# Patient Record
Sex: Female | Born: 1970 | Race: White | Hispanic: No | Smoking: Current every day smoker
Health system: Southern US, Community
[De-identification: ages and names within clinical notes are randomized; demographics above are authoritative.]

## PROBLEM LIST (undated history)

## (undated) ENCOUNTER — Emergency Department (HOSPITAL_COMMUNITY): Admission: EM | Discharge status: Absent without leave | Payer: BC Managed Care – PPO | Source: Home / Self Care

## (undated) DIAGNOSIS — I10 Essential (primary) hypertension: Secondary | ICD-10-CM

## (undated) DIAGNOSIS — F419 Anxiety disorder, unspecified: Secondary | ICD-10-CM

## (undated) DIAGNOSIS — E669 Obesity, unspecified: Secondary | ICD-10-CM

## (undated) HISTORY — DX: Essential (primary) hypertension: I10

## (undated) HISTORY — DX: Obesity, unspecified: E66.9

---

## 1998-11-29 ENCOUNTER — Other Ambulatory Visit: Admission: RE | Admit: 1998-11-29 | Discharge: 1998-11-29 | Payer: Self-pay | Admitting: Gynecology

## 2000-01-31 ENCOUNTER — Other Ambulatory Visit: Admission: RE | Admit: 2000-01-31 | Discharge: 2000-01-31 | Payer: Self-pay | Admitting: Gynecology

## 2001-04-15 ENCOUNTER — Other Ambulatory Visit: Admission: RE | Admit: 2001-04-15 | Discharge: 2001-04-15 | Payer: Self-pay | Admitting: Gynecology

## 2002-04-22 ENCOUNTER — Other Ambulatory Visit: Admission: RE | Admit: 2002-04-22 | Discharge: 2002-04-22 | Payer: Self-pay | Admitting: Gynecology

## 2003-06-30 ENCOUNTER — Other Ambulatory Visit: Admission: RE | Admit: 2003-06-30 | Discharge: 2003-06-30 | Payer: Self-pay | Admitting: Gynecology

## 2004-07-01 ENCOUNTER — Other Ambulatory Visit: Admission: RE | Admit: 2004-07-01 | Discharge: 2004-07-01 | Payer: Self-pay | Admitting: Gynecology

## 2005-03-20 ENCOUNTER — Encounter: Admission: RE | Admit: 2005-03-20 | Discharge: 2005-03-20 | Payer: Self-pay | Admitting: Gynecology

## 2005-12-19 ENCOUNTER — Inpatient Hospital Stay (HOSPITAL_COMMUNITY): Admission: AD | Admit: 2005-12-19 | Discharge: 2005-12-22 | Payer: Self-pay | Admitting: Obstetrics & Gynecology

## 2007-07-18 ENCOUNTER — Ambulatory Visit (HOSPITAL_COMMUNITY): Admission: RE | Admit: 2007-07-18 | Discharge: 2007-07-18 | Payer: Self-pay | Admitting: Internal Medicine

## 2009-07-16 ENCOUNTER — Ambulatory Visit (HOSPITAL_COMMUNITY): Admission: RE | Admit: 2009-07-16 | Discharge: 2009-07-16 | Payer: Self-pay | Admitting: Family Medicine

## 2010-11-25 NOTE — Op Note (Signed)
Carla Cohen, PANT                 ACCOUNT NO.:  [id number]   MEDICAL RECORD NO.:  [id number]          PATIENT TYPE:  INP   LOCATION:  9131                          FACILITY:  [facility]   PHYSICIAN:  Gerrit Friends. Aldona Bar, M.D.   DATE OF BIRTH:  [birth year]   DATE OF PROCEDURE:  12/19/2005  DATE OF DISCHARGE:                                 OPERATIVE REPORT   PREOPERATIVE DIAGNOSIS:  Arrest of second stage of labor.   POSTOPERATIVE DIAGNOSES:  Arrest of second stage of labor plus delivery of 8  pound 9 ounce female infant, Apgars 08/09.   PROCEDURE:  Primary low transverse cesarean section.   ANESTHESIA:  Epidural.   SURGEON:  Gerrit Friends. Aldona Bar, M.D.   HISTORY:  This 40 year old gravida 1, para 0 presented at term with rupture  of membranes early morning on 12/19/2005.  She was treated with intrapartum  clindamycin secondary to a positive group B strep culture (allergic to  penicillin).  She progressed well during her labor becoming fully dilated at  1700 hours.  At that time the vertex was at +1 to +2.  The patient pushed  for over 2-1/2 hours with minimal descent of the vertex to at best +1 to +2  with a contraction and at best +1 in between contractions.  Fetal heart  remained reassuring because of an arrest disorder second stage of labor  after at least 2-1/2 hours of pushing. Decision was made after discussion  with the patient and husband to proceed to cesarean section for delivery.   The patient was taken to the operating room with a Foley catheter in place  where her epidural was augmented without difficulty.  The patient was  prepped and draped in usual fashion and after good epidural analgesia was  documented. The procedure was begun.  Pfannenstiel incision was made with  minimal difficulty dissected down sharply to and through the fascia in a low  transverse fashion with hemostasis created at each layer.  Subfascial space  was created inferiorly and superiorly, muscles separated  midline.  Peritoneum identified appropriately with care taken to avoid the bladder.  Peritoneum was opened and bladder blade was placed. Vesicouterine peritoneum  was identified, incised a low transverse fashion, pushed off the lower  uterine segment with ease and sharp incision with Metzenbaum scissors was  made in a low transverse fashion and once the cavity was entered.  The  incision was extended laterally with fingers.  Thereafter because the head  was so well descended in the pelvis with some difficulty the vertex was  elevated and thereafter required a vacuum extractor for subsequent delivery.  The infant cried spontaneously at once after delivery and after cord was  clamped and cut, the infant was passed off to the waiting team and  subsequently taken to nursery in good condition.  Apgars were noted to be 8-  9 and subsequent weight was found to be 8 pounds 9 ounces and the baby was  female.   Cord bloods were collected.  True knot was noted in the cord.  Placenta was  then  delivered.  Thereafter the uterus was delivered from the abdominal  incision, rendered free of any remaining products of conception.  Good  contractility was afforded with slowly given intravenous Pitocin and manual  stimulation and ultimately closure of the uterine incision was begun.  The  uterus itself appeared normal with exception of a 3-4 cm pedunculated myoma  coming off the posterior right lateral surface.  Both tubes and ovaries  appeared normal.   Uterine incision was closed with #1 Vicryl in running locking fashion  followed by second layer of #1 Vicryl in imbricating fashion.  Several  additional figure-of-eight 0 Vicryls were applied for additional hemostasis.  At this time the uterus was replaced in the abdominal incision and abdomen  lavaged of all free blood and clot and after all counts noted to be correct  and no foreign bodies noted to be remaining  in abdominal cavity, closure of  the  abdomen was begun in layers.  The abdominal peritoneum was closed with 0  Vicryl in running fashion.  Muscles secured thereafter office with the same.  Assured of the fascial hemostasis the fascia was then reapproximated 0  Vicryl from angle to midline bilaterally.  Subcu hemostasis was created.  The skin was then closed staples.  At this time it was noted that there was  some blood in the catheter probably secondary to bladder trauma secondary to  the process of delivery but it appeared as the procedure was concluding that  what was seen in the Foley catheter was becoming less red consistent with a  problem that was going to resolve without difficulty.  At this time sterile  pressure dressing was applied and the patient was transported to recovery in  satisfactory condition having tolerated the procedure well.  Estimated blood  loss between 500-600 mL.  All counts correct x2.  The conclusion of  procedure both mother and baby doing well in respective recovery areas.   CONCLUSION:  This patient was taken to the operating room because of arrest  of the second stage of labor and was delivered of an 8 pound 9 ounce female  infant with Apgars of 08/09 by primary low transverse cesarean section which  was complicated by the vertex being at +1 to +2 station and after elevation  of the vertex, vacuum extractor was required for ultimate delivery.      Gerrit Friends. Aldona Bar, M.D.  Electronically Signed     RMW/MEDQ  D:  12/19/2005  T:  12/20/2005  Job:  [id number]

## 2010-11-25 NOTE — Discharge Summary (Signed)
NAMEKENNETTE, CUTHRELL                 ACCOUNT NO.:  [id number]   MEDICAL RECORD NO.:  [id number]          PATIENT TYPE:  INP   LOCATION:  9131                          FACILITY:  [facility]   PHYSICIAN:  Gerrit Friends. Aldona Bar, M.D.   DATE OF BIRTH:  08-13-1970   DATE OF ADMISSION:  12/19/2005  DATE OF DISCHARGE:  12/22/2005                                 DISCHARGE SUMMARY   DISCHARGE DIAGNOSES:  1.  Term pregnancy, delivered, 8 pound 9 ounce female infant, Apgars 8 and 9.  2.  Blood type A positive.  3.  Positive Group B Streptococcus antenatally.  4.  Arrest disorder second stage of labor.   PROCEDURE:  Primary low-transverse cesarean section.   HOSPITAL COURSE:  This 40 year old primigravida was admitted at term after  rupture of membranes/clear fluid.  She was treated with clindamycin because  of a positive strep culture (allergic to penicillin).  Her labor progressed  well, but during the second stage, she arrested and essentially after 2-1/2  hours of pushing there was minimal descent of vertex to at best +1 without a  contraction and +1 to +2 with contraction.  Fetal heart remained reassuring.  She was taken to the operating for a primary low transverse cesarean section  and delivered an 8 pound 9 ounce female infant with Apgars of 8 and 9.  There  was a true knot in cord.  The vacuum extractor was required for delivery of  the vertex after elevation vertex.  Postoperatively, there was some blood in  the Foley catheter, but this cleared quickly.  This was probably secondary  to bladder trauma associated with the delivery.   The patient's postpartum course otherwise was totally benign.  Her discharge  hemoglobin was 9.9 with a white count of 16,800 and a  platelet count of  150,000.  She remained afebrile during her hospital course and on the  morning of June 15, was ambulating well, tolerating a regular diet well,  having normal bowel and bladder function, afebrile, wound was clean and dry  and  her bottle-feeding was going well.  She requested discharge and  appropriately after understanding all instructions was discharged to home  after her staples were removed and her wound was Steri-Stripped with  Benzoin.   DISCHARGE MEDICATIONS:  1.  Tylox 1-2 every 4-6 hours as needed for pain.  2.  Motrin 600 mg every 6 hours as needed for pain or cramps.  3.  Feosol capsules one a day.  4.  She will begin Ortho-Cept birth control pills three Sundays from now.  5.  She was also given a prescription for hydrochlorothiazide 12.5 mg      tablets to use one in the morning with orange juice for fluid retention      which she is still experiencing.   FOLLOW UP:  She will return to the office for followup in approximately 4  weeks' time.   CONDITION ON DISCHARGE:  Improved.      Gerrit Friends. Aldona Bar, M.D.  Electronically Signed     RMW/MEDQ  D:  12/22/2005  T:  12/22/2005  Job:  [id number]

## 2012-08-17 ENCOUNTER — Emergency Department (HOSPITAL_COMMUNITY)
Admission: EM | Admit: 2012-08-17 | Discharge: 2012-08-17 | Disposition: A | Discharge status: Home / Self Care | Payer: BC Managed Care – PPO | Attending: Emergency Medicine | Admitting: Emergency Medicine

## 2012-08-17 ENCOUNTER — Emergency Department (HOSPITAL_COMMUNITY): Payer: BC Managed Care – PPO

## 2012-08-17 DIAGNOSIS — S61209A Unspecified open wound of unspecified finger without damage to nail, initial encounter: Secondary | ICD-10-CM | POA: Insufficient documentation

## 2012-08-17 DIAGNOSIS — Y9389 Activity, other specified: Secondary | ICD-10-CM | POA: Insufficient documentation

## 2012-08-17 DIAGNOSIS — Y929 Unspecified place or not applicable: Secondary | ICD-10-CM | POA: Insufficient documentation

## 2012-08-17 DIAGNOSIS — W268XXA Contact with other sharp object(s), not elsewhere classified, initial encounter: Secondary | ICD-10-CM | POA: Insufficient documentation

## 2012-08-17 DIAGNOSIS — IMO0002 Reserved for concepts with insufficient information to code with codable children: Secondary | ICD-10-CM

## 2012-08-17 MED ORDER — CEPHALEXIN 500 MG PO CAPS: 500.0000 mg | ORAL_CAPSULE | Freq: Four times a day (QID) | ORAL | Status: DC

## 2012-08-17 NOTE — ED Notes (Signed)
All documentation during pt's visit done on downtime forms per protocol.

## 2012-08-17 NOTE — ED Provider Notes (Signed)
History     CSN: [id number]  Arrival date & time 08/17/12  0105   None    Patient charting originally done on downtime requisitions.  No chief complaint on file.   (Consider location/radiation/quality/duration/timing/severity/associated sxs/prior treatment) HPI Carla Cohen is a 42 y.o. female presents after a partial avulsion injury of the distal tip of her third digit on the right hand. Patient is right-handed and said she cut her finger while cutting limes. According to nursing reports, she had mentioned to somebody else that she had cut herself while washing glasses in the sink. Patient's pain is severe it is well localized is not associated with any numbness or tingling, no loss of function hand she can flex and extend the hand without problems. Palpation of the injury hurts more and flexion of the hand hurts slightly more. There are no other alleviating or exacerbating factors no other associated symptoms. She denies any chest pains, shortness of breath, abdominal pains, nausea vomiting, diarrhea, headache.   No past medical history on file.  No past surgical history on file.  No family history on file.  History  Substance Use Topics  . Smoking status: Not on file  . Smokeless tobacco: Not on file  . Alcohol Use: Not on file    OB History   No data available      Review of Systems At least 10pt or greater review of systems completed and are negative except where specified in the HPI.  Allergies  Review of patient's allergies indicates not on file.  Home Medications  No current outpatient prescriptions on file.  LMP 08/08/2012  Physical Exam  Musculoskeletal:       Hands:   Nursing notes reviewed.  Electronic medical record reviewed. VITAL SIGNS:  There were no vitals filed for this visit. CONSTITUTIONAL: Awake, oriented, appears non-toxic HENT: Atraumatic, normocephalic, oral mucosa pink and moist, airway patent. Nares patent without drainage. External ears  normal. EYES: Conjunctiva clear, EOMI, PERRLA NECK: Trachea midline, non-tender, supple CARDIOVASCULAR: Normal heart rate, Normal rhythm, No murmurs, rubs, gallops PULMONARY/CHEST: Clear to auscultation, no rhonchi, wheezes, or rales. Symmetrical breath sounds. Non-tender. ABDOMINAL: Non-distended, soft, non-tender - no rebound or guarding.  BS normal. NEUROLOGIC: Non-focal, moving all four extremities, no gross sensory or motor deficits. EXTREMITIES: No clubbing, cyanosis, or edema. Avulsion type injury with jagged edges noted to the distal end of the third digit on the right hand. There are no clean cut lines, the distal fingertip is connected via a large volar flap, and the nailbed has been torn from the proximal nail bed.  Distal tuft of the distal phalanx is evident. SKIN: Warm, Dry, No erythema, No rash  ED Course  LACERATION REPAIR Date/Time: 08/17/2012 7:07 AM Performed by: Jones Skene Authorized by: Jones Skene Consent: Verbal consent obtained. Risks and benefits: risks, benefits and alternatives were discussed Consent given by: patient Body area: upper extremity Location details: right long finger Laceration length: 2 cm Foreign bodies: no foreign bodies Tendon involvement: none Nerve involvement: none Vascular damage: no Anesthesia: digital block Local anesthetic: bupivacaine 0.5% without epinephrine and lidocaine 2% without epinephrine Anesthetic total: 4 ml Patient sedated: no Preparation: Patient was prepped and draped in the usual sterile fashion. Irrigation solution: saline and tap water (Patient's injury was irrigated under the tap for approximately 10 minutes. Once the nail was removed her nail bed was irrigated with approximately 350 cc of normal saline.) Irrigation method: tap Amount of cleaning: extensive Debridement: minimal Degree of undermining: none  Skin closure: 5-0 nylon and 4-0 nylon Wound mucous membrane closure material used: 5-0 gut. Number of  sutures: 8 Technique: simple Approximation: close Approximation difficulty: complex Dressing: Vaseline gauze & fingertip aluminum splint-  Patient tolerance: Patient tolerated the procedure well with no immediate complications. Comments: Patient has a fairly significant avulsion injury to the distal third phalanx.  Due to the nailbed injury, the nail had to be removed, approximately half of the nailbed had been peeled back gently using an iris scissors to separate the nail bed from the nail. After this was done, a mosquito was used to remove the nail and it was irrigated copiously. The nailbed was repaired with 5-0 plain gut, there was some avulsion of tissue but it was approximated fairly well. Distal tip was irrigated with some minor debridement of macerated tissue. At this point I placed 3 interrupted sutures through the skin of 5-0 nylon and approximated the edges of the skin very well. There is a sufficient flap on the volar surface that I think that the avulsed tip should survive.  Placed a small hole using electrocautery about 2-3 mm directly through the nail to allow for drainage. At this point I placed the nail back on the nailbed and sutured it to the patient's finger using 5-0 chromic gut with 2 sutures.     (including critical care time)  Labs Reviewed - No data to display No results found.  x-ray showed no other foreign bodies besides the distal tip of the phalangeal tuft and a avulsion injury to the distal tip of the third digit on the right hand appear x-ray is interpreted by myself   No diagnosis found. Fingertip avulsion injury Nailbed avulsion  MDM  Carla Cohen is a 42 y.o. female presenting with a fairly significant avulsion injury to the distal third phalanx. This was repaired as dictated above. Patient will need to return in 2 days for a wound check. After that she will followup with Dr. Romeo Apple, orthopedist in town for further observation and management. Patient was  informed that she may need further surgical intervention depending on how well the flap survives. Patient was placed on Keflex due to the nature of the injury and exposed bone.    I explained the diagnosis and have given explicit precautions to return to the ER including signs of infection, draining pus, worsening pain or any other new or worsening symptoms. The patient understands and accepts the medical plan as it's been dictated and I have answered their questions. Discharge instructions concerning home care and prescriptions have been given.  The patient is STABLE and is discharged to home in good condition.         Jones Skene, MD 08/17/12 [id number]

## 2012-08-18 ENCOUNTER — Encounter (HOSPITAL_COMMUNITY): Payer: Self-pay

## 2012-08-18 ENCOUNTER — Emergency Department (HOSPITAL_COMMUNITY)
Admission: EM | Admit: 2012-08-18 | Discharge: 2012-08-18 | Disposition: A | Discharge status: Home / Self Care | Payer: BC Managed Care – PPO | Attending: Emergency Medicine | Admitting: Emergency Medicine

## 2012-08-18 DIAGNOSIS — Z4800 Encounter for change or removal of nonsurgical wound dressing: Secondary | ICD-10-CM | POA: Insufficient documentation

## 2012-08-18 DIAGNOSIS — Z5189 Encounter for other specified aftercare: Secondary | ICD-10-CM

## 2012-08-18 NOTE — ED Provider Notes (Signed)
History  This chart was scribed for Carla Lennert, MD by Erskine Emery, ED Scribe. This patient was seen in room APA10/AP[facility]10 and the patient's care was started at 16:44.   CSN: [id number]  Arrival date & time 08/18/12  1516   First MD Initiated Contact with Patient 08/18/12 1644      Chief Complaint  Patient presents with  . Wound Check    (Consider location/radiation/quality/duration/timing/severity/associated sxs/prior Treatment) CORTINA VULTAGGIO is a 42 y.o. female who presents to the Emergency Department for wound check. Pt was here early Saturday morning after she tripped and cut her right middle finger on a glass in the sink. She had the finger x-rayed and was found to have no fractures, just a laceration that was stitched and dressed. Pt was told to return for a recheck on Sunday. She has no other pains or complaints. Patient is a 42 y.o. female presenting with wound check. The history is provided by the patient. No language interpreter was used.  Wound Check This is a new problem. The current episode started yesterday. The problem occurs constantly. The problem has not changed since onset.Pertinent negatives include no chest pain, no abdominal pain, no headaches and no shortness of breath. Exacerbated by: pressure on the area. The symptoms are relieved by medications and acetaminophen. Treatments tried: ibuprofen. The treatment provided significant relief.  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  Pt has no PCP, but she was told to follow up with orthopedic surgeon, Dr. Romeo Apple, this week.  History reviewed. No pertinent past medical history.  Past Surgical History  Procedure Laterality Date  . Cesarean section      No family history on file.  History  Substance Use Topics  . Smoking status: Never Smoker   . Smokeless tobacco: Not on file  . Alcohol Use: No    OB History   Grav Para Term Preterm Abortions TAB SAB Ect Mult Living                   Review of Systems  Constitutional: Negative for fatigue.  HENT: Negative for congestion, sinus pressure and ear discharge.   Eyes: Negative for discharge.  Respiratory: Negative for cough and shortness of breath.   Cardiovascular: Negative for chest pain.  Gastrointestinal: Negative for abdominal pain and diarrhea.  Genitourinary: Negative for frequency and hematuria.  Musculoskeletal: Negative for back pain.  Skin: Positive for wound (right middle finger). Negative for rash.  Neurological: Negative for seizures and headaches.  Psychiatric/Behavioral: Negative for hallucinations.  All other systems reviewed and are negative.    Allergies  Review of patient's allergies indicates no known allergies.  Home Medications   Current Outpatient Rx  Name  Route  Sig  Dispense  Refill  . cephALEXin (KEFLEX) 500 MG capsule   Oral   Take 1 capsule (500 mg total) by mouth 4 (four) times daily.   14 capsule   0   . FLUoxetine (PROZAC) 20 MG capsule   Oral   Take 20 mg by mouth 2 (two) times daily.           Triage Vitals: BP 142/90  Pulse 75  Temp(Src) 98.3 F (36.8 C) (Oral)  Resp 30  Ht 5\' 9"  (1.753 m)  Wt 155 lb (70.308 kg)  BMI 22.88 kg/m2  SpO2 100%  LMP 08/08/2012  Physical Exam  Nursing note and vitals reviewed. Constitutional: She is oriented to person, place, and time. She  appears well-developed.  HENT:  Head: Normocephalic.  Eyes: Conjunctivae are normal.  Neck: No tracheal deviation present.  Cardiovascular:  No murmur heard. Musculoskeletal: Normal range of motion.  Neurological: She is oriented to person, place, and time.  Skin: Skin is warm.  Healing laceration to distal middle finger.  Psychiatric: She has a normal mood and affect.    ED Course  Procedures (including critical care time) DIAGNOSTIC STUDIES: Oxygen Saturation is 100% on room air, normal by my interpretation.    COORDINATION OF CARE: 16:53--I evaluated the patient and we  discussed a treatment plan including daily cleaning and redressing to which the pt agreed.    Labs Reviewed - No data to display Dg Hand Complete Right  08/17/2012  *RADIOLOGY REPORT*  Clinical Data: Laceration to the right third finger with a knife.  RIGHT HAND - COMPLETE 3+ VIEW  Comparison: None.  Findings: Soft tissue defect consistent with laceration of the distal aspect of the right third finger with displaced fracture of the distal phalangeal tuft.  No other fractures identified.  No radiopaque soft tissue foreign bodies.  IMPRESSION: Deep laceration distal right third phalanx with displaced fracture of the distal phalangeal tuft.   Original Report Authenticated By: Burman Nieves, M.D.      No diagnosis found.    MDM  Healing laceration      The chart was scribed for me under my direct supervision.  I personally performed the history, physical, and medical decision making and all procedures in the evaluation of this patient.Carla Lennert, MD 08/18/12 [phone]

## 2012-08-18 NOTE — ED Notes (Signed)
Pt here for wound recheck, wound it on right hand middle finger

## 2012-08-18 NOTE — ED Notes (Signed)
Pt was seen here early Saturday morning after she cut her hand on glass. Pt's finger was stitched up and pt was told to come back for recheck today.

## 2012-08-19 MED FILL — Fentanyl Citrate Inj 0.05 MG/ML: INTRAMUSCULAR | Qty: 2 | Status: AC

## 2012-08-19 MED FILL — Lidocaine HCl Local Inj 2%: INTRAMUSCULAR | Qty: 10 | Status: AC

## 2012-08-19 MED FILL — Tetanus-Diphtheria Toxoids (Td) Inj 5-2 LFU: INTRAMUSCULAR | Qty: 0.5 | Status: AC

## 2012-08-20 ENCOUNTER — Ambulatory Visit (INDEPENDENT_AMBULATORY_CARE_PROVIDER_SITE_OTHER): Payer: BC Managed Care – PPO | Admitting: Orthopedic Surgery

## 2012-08-20 ENCOUNTER — Encounter: Payer: Self-pay | Admitting: Orthopedic Surgery

## 2012-08-20 DIAGNOSIS — S61319A Laceration without foreign body of unspecified finger with damage to nail, initial encounter: Secondary | ICD-10-CM | POA: Insufficient documentation

## 2012-08-20 DIAGNOSIS — S61209A Unspecified open wound of unspecified finger without damage to nail, initial encounter: Secondary | ICD-10-CM

## 2012-08-20 NOTE — Patient Instructions (Signed)
Keep clean and dry continue the antibiotics   Return for suture removal on Feb 17th

## 2012-08-20 NOTE — Progress Notes (Signed)
Patient ID: Carla Cohen, female   DOB: [birth year], 42 y.o.   MRN: [id number] Chief Complaint  Patient presents with  . 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 Started on Keflex 500 mg every 6 hours. She's been taking every 12 hours secondary to a sleepiness. Pain is improved ibuprofen. Complains of 4/10. No intermittent pain with some numbness at the fingertip.  She reports numbness and tingling some anxiety denies any other skin changes. She gives a history of a cesarean section. No medical problems currently takes generic Prozac. Family history cancer and diabetes. Reports that she is separated, has a patulous degree does not smoke.  Radiograph shows distal to the fracture.  ER notes indicate that the nail was decompressed and sutures were applied.  Exam shows a well-developed, well-nourished, female, with a weight of 160 height of 5 foot 9 inches. Normal pulses. Oriented x3 normal mood.  The finger is noted to have a laceration dorsally or volarly with a volar expansion of tissue not involved in the laceration are no signs of infection. Distal interphalangeal joint function is normal. No instability. Normal flexion, extension, strength. Good capillary refill. Some mild sensory loss on each side of the fingertip.  Again, x-ray was reviewed with the report.  Impression laceration with tuft fracture.  Recommend dressing change and come back for suture removal. Continue antibiotics.

## 2012-08-26 ENCOUNTER — Encounter: Payer: Self-pay | Admitting: Orthopedic Surgery

## 2012-08-26 ENCOUNTER — Ambulatory Visit (INDEPENDENT_AMBULATORY_CARE_PROVIDER_SITE_OTHER): Payer: BC Managed Care – PPO | Admitting: Orthopedic Surgery

## 2012-08-26 VITALS — BP 130/80 | Ht 69.0 in | Wt 160.0 lb

## 2012-08-26 DIAGNOSIS — S61209A Unspecified open wound of unspecified finger without damage to nail, initial encounter: Secondary | ICD-10-CM

## 2012-08-26 DIAGNOSIS — S61319A Laceration without foreign body of unspecified finger with damage to nail, initial encounter: Secondary | ICD-10-CM

## 2012-08-26 NOTE — Progress Notes (Signed)
Patient ID: Carla Cohen, female   DOB: 17-Jul-1970, 42 y.o.   MRN: [id number] Chief Complaint  Patient presents with  . Follow-up    follow up right long finger DOI 08/17/12    Status post partial amputation, RIGHT long finger with repair of nailbed and the suturing of the end of the finger. None of the finger was lost.  The patient is on antibiotics came in today for suture removal. 5 sutures were removed. 2 absorbable and 2 non-absorbable  There are no signs of infection. Normal motion is noted at the tip of the finger. She still has some numbness on each side of the digit  Continued antibiotics until he is seen in 6 weeks to check on the progress of the nail, and we'll make sure that no infection

## 2012-08-26 NOTE — Patient Instructions (Addendum)
Finish antibiotics   Change band aid as needed

## 2012-10-08 ENCOUNTER — Ambulatory Visit: Payer: BC Managed Care – PPO | Admitting: Orthopedic Surgery

## 2012-10-17 ENCOUNTER — Ambulatory Visit: Payer: BC Managed Care – PPO | Admitting: Orthopedic Surgery

## 2012-11-07 ENCOUNTER — Ambulatory Visit: Payer: BC Managed Care – PPO | Admitting: Orthopedic Surgery

## 2012-12-24 ENCOUNTER — Telehealth: Payer: Self-pay | Admitting: Nurse Practitioner

## 2012-12-24 NOTE — Telephone Encounter (Signed)
Nasal congestion, sore throat, runny nose, and nonproductive cough x 2 days.  Denies headache or facial pain/pressure.  The cough isn't persistent.   No history of HTN or diabetes.  Has taken generic Zyrtec and Claritin.  Has not taken any decongestants.    Suggested pseudoephedrine 1-2 tabs every 4-6 hrs PRN.  Continue zyrtec or claritin and add ibuprofen prn for pain.  Explained that viruses usually peak about the 4th-5th day and then she should start feeling better.    She will schedule an appointment if she develops a fever or if symptoms persist or worsen.  Patient stated understanding and agreement to plan.

## 2013-07-11 ENCOUNTER — Encounter (INDEPENDENT_AMBULATORY_CARE_PROVIDER_SITE_OTHER): Payer: Self-pay

## 2013-07-11 ENCOUNTER — Telehealth: Payer: Self-pay | Admitting: Family Medicine

## 2013-07-11 ENCOUNTER — Encounter: Payer: Self-pay | Admitting: Family Medicine

## 2013-07-11 ENCOUNTER — Ambulatory Visit (INDEPENDENT_AMBULATORY_CARE_PROVIDER_SITE_OTHER): Payer: BC Managed Care – PPO | Admitting: Family Medicine

## 2013-07-11 VITALS — BP 127/82 | HR 94 | Temp 99.2°F | Ht 69.0 in | Wt 163.0 lb

## 2013-07-11 DIAGNOSIS — J029 Acute pharyngitis, unspecified: Secondary | ICD-10-CM

## 2013-07-11 LAB — POCT RAPID STREP A (OFFICE): Rapid Strep A Screen: POSITIVE — AB

## 2013-07-11 LAB — POCT INFLUENZA A/B
Influenza A, POC: NEGATIVE
Influenza B, POC: NEGATIVE

## 2013-07-11 MED ORDER — CEFTRIAXONE SODIUM 1 G IJ SOLR
1.0000 g | INTRAMUSCULAR | Status: AC
  Administered 2013-07-11: 1 g via INTRAMUSCULAR

## 2013-07-11 MED ORDER — AZITHROMYCIN 250 MG PO TABS: ORAL_TABLET | ORAL | Status: DC

## 2013-07-11 NOTE — Patient Instructions (Signed)

## 2013-07-11 NOTE — Progress Notes (Signed)
   Subjective:    Patient ID: Elveria RisingDana H Lamke, female    DOB: 11-Aug-1970, [age] y.o.   MRN: [id number]  HPI  This 43 y.o. female presents for evaluation of uri sx's for over a week.  Review of Systems    No chest pain, SOB, HA, dizziness, vision change, N/V, diarrhea, constipation, dysuria, urinary urgency or frequency, myalgias, arthralgias or rash.  Objective:   Physical Exam  Vital signs noted  Well developed well nourished female.  HEENT - Head atraumatic Normocephalic                Eyes - PERRLA, Conjuctiva - clear Sclera- Clear EOMI                Ears - EAC's Wnl TM's Wnl Gross Hearing WNL                Nose - Nares patent                 Throat - oropharanx injected Respiratory - Lungs CTA bilateral Cardiac - RRR S1 and S2 without murmur GI - Abdomen soft Nontender and bowel sounds active x 4 Extremities - No edema. Neuro - Grossly intact.  Results for orders placed in visit on 07/11/13  POCT RAPID STREP A (OFFICE)      Result Value Range   Rapid Strep A Screen Positive (*) Negative  POCT INFLUENZA A/B      Result Value Range   Influenza A, POC Negative     Influenza B, POC Negative        Assessment & Plan:  Acute pharyngitis - Plan: POCT rapid strep A, POCT Influenza A/B, cefTRIAXone (ROCEPHIN) injection 1 g, azithromycin (ZITHROMAX) 250 MG tablet  Push po fluids, rest, tylenol and motrin otc prn as directed for fever, arthralgias, and myalgias.  Follow up prn if sx's continue or persist.  Deatra CanterWilliam J Oxford FNP

## 2014-04-05 ENCOUNTER — Emergency Department (HOSPITAL_COMMUNITY)
Admission: EM | Admit: 2014-04-05 | Discharge: 2014-04-05 | Disposition: A | Discharge status: Home / Self Care | Payer: BC Managed Care – PPO | Attending: Emergency Medicine | Admitting: Emergency Medicine

## 2014-04-05 ENCOUNTER — Encounter (HOSPITAL_COMMUNITY): Payer: Self-pay | Admitting: Emergency Medicine

## 2014-04-05 ENCOUNTER — Emergency Department (HOSPITAL_COMMUNITY): Payer: BC Managed Care – PPO

## 2014-04-05 DIAGNOSIS — F411 Generalized anxiety disorder: Secondary | ICD-10-CM | POA: Insufficient documentation

## 2014-04-05 DIAGNOSIS — Z79899 Other long term (current) drug therapy: Secondary | ICD-10-CM | POA: Insufficient documentation

## 2014-04-05 DIAGNOSIS — F17200 Nicotine dependence, unspecified, uncomplicated: Secondary | ICD-10-CM | POA: Diagnosis not present

## 2014-04-05 DIAGNOSIS — F419 Anxiety disorder, unspecified: Secondary | ICD-10-CM

## 2014-04-05 DIAGNOSIS — I159 Secondary hypertension, unspecified: Secondary | ICD-10-CM

## 2014-04-05 DIAGNOSIS — I158 Other secondary hypertension: Secondary | ICD-10-CM | POA: Diagnosis not present

## 2014-04-05 DIAGNOSIS — I10 Essential (primary) hypertension: Secondary | ICD-10-CM | POA: Diagnosis present

## 2014-04-05 HISTORY — DX: Essential (primary) hypertension: I10

## 2014-04-05 LAB — I-STAT CHEM 8, ED
BUN: 14 mg/dL (ref 6–23)
CALCIUM ION: 1.23 mmol/L (ref 1.12–1.23)
CHLORIDE: 103 meq/L (ref 96–112)
Creatinine, Ser: 0.7 mg/dL (ref 0.50–1.10)
Glucose, Bld: 91 mg/dL (ref 70–99)
HEMATOCRIT: 42 % (ref 36.0–46.0)
HEMOGLOBIN: 14.3 g/dL (ref 12.0–15.0)
POTASSIUM: 4 meq/L (ref 3.7–5.3)
Sodium: 138 mEq/L (ref 137–147)
TCO2: 34 mmol/L (ref 0–100)

## 2014-04-05 LAB — I-STAT TROPONIN, ED: Troponin i, poc: 0 ng/mL (ref 0.00–0.08)

## 2014-04-05 NOTE — ED Notes (Signed)
Pt comes in because she states her BP and heart rate ran high for several hours this morning.  When asked if she felt more stressed or anxious this morning she clarified that was the way she felt. She states she feels much better now but still thinks something is wrong. She "has a twitching in her chest that comes and goes but doesn't hurt."

## 2014-04-05 NOTE — Discharge Instructions (Signed)
°Emergency Department Resource Guide °1) Find a Doctor and Pay Out of Pocket °Although you won't have to find out who is covered by your insurance plan, it is a good idea to ask around and get recommendations. You will then need to call the office and see if the doctor you have chosen will accept you as a new patient and what types of options they offer for patients who are self-pay. Some doctors offer discounts or will set up payment plans for their patients who do not have insurance, but you will need to ask so you aren't surprised when you get to your appointment. ° °2) Contact Your Local Health Department °Not all health departments have doctors that can see patients for sick visits, but many do, so it is worth a call to see if yours does. If you don't know where your local health department is, you can check in your phone book. The CDC also has a tool to help you locate your state's health department, and many state websites also have listings of all of their local health departments. ° °3) Find a Walk-in Clinic °If your illness is not likely to be very severe or complicated, you may want to try a walk in clinic. These are popping up all over the country in pharmacies, drugstores, and shopping centers. They're usually staffed by nurse practitioners or physician assistants that have been trained to treat common illnesses and complaints. They're usually fairly quick and inexpensive. However, if you have serious medical issues or chronic medical problems, these are probably not your best option. ° °No Primary Care Doctor: °- Call [facility] at  832-8000 - they can help you locate a primary care doctor that  accepts your insurance, provides certain services, etc. °- Physician Referral Service- 1-800-533-3463 ° °Chronic Pain Problems: °Organization         Address  Phone   Notes  °[facility]  (336) 297-2271 Patients need to be referred by their primary care doctor.  ° °Medication  Assistance: °Organization         Address  Phone   Notes  °[place] [facility] [address] °[place], [place] [address] (336) 641-8030 --Must be a resident of [place] °-- Must have NO insurance coverage whatsoever (no Medicaid/ Medicare, etc.) °-- The pt. MUST have a primary care doctor that directs their care regularly and follows them in the community °  °[facility]  (866) 331-1348   °[employer]  (888) 892-1162   ° °Agencies that provide inexpensive medical care: °Organization         Address  Phone   Notes  °[facility]  (336) 832-8035   °[facility]    (336) 832-7272   °[facility] [address] °[place],  [address] (336) 832-4777   °[facility] [address], °[place] (336) 271-4999   °[facility]    (336) 373-0678   °[facility]    (336) 272-1050   °[facility] ° [address], [place] Phone:  (336) 832-4444, Fax:  (336) 832-4440 Hours of Operation:  9 am - 6 pm, M-F.  Also accepts Medicaid/Medicare and self-pay.  °[facility] ° [address], [place] Phone: (336) 832-3150, Fax: (336) 832-3151. Hours of Operation:  8:30 am - 5:30 pm, M-F.  Also accepts Medicaid and self-pay.  °[facility] [place] [address], [place] Phone: (336) 878-6027   °[facility] [address], [place], [place] (336)723-1848, Ext. 123 Mondays & Thursdays: 7-9 AM.  First 15 patients are seen on a first come, first serve basis. °  ° °Medicaid-accepting [place] Providers: ° °Organization         Address  Phone   Notes  °[facility] [address], [place] (336) 641-2100 Also accepts self-pay patients.  °[facility] [address], [place] ° (336) 856-9996   °[facility] [address], [place]  (336) 288-8857   °[facility] [address] [place] [address], [place] (336) 299-7000   °Veita Bland [address], [place]  ° (336) 373-1557 Only accepts [place] Access Medicaid patients after they have their name applied to their card.  ° °Self-Pay (no insurance) in [place]: ° °Organization         Address  Phone   Notes  °Sickle Cell Patients, [facility] [address], [place] (336) 832-1970   °[facility] [address], [place] (336) 832-4400   °[facility] [place] ° [address], [place] (336) 992-4800   °[facility]/Dr. Osei-Bonsu ° [address] [place] [address], [place] or [address], [place] (336) 841-8500 Phone number for both [place] and [place] locations is the same.  °[facility] [address], [place] (336) 299-0000   °[facility] [address] [place] [address], [place] or [address] (336) 852-7530 °(336) 878-2260   °[facility] [address], [place] (336) 350-1642, phone; (336) 294-5005, fax Sees patients 1st and 3rd Saturday of every month.  Must not qualify for public or private insurance (i.e. Medicaid, Medicare, [place] Health Choice, Veterans' Benefits) • Household income should be no more than 200% of the poverty level •The clinic cannot treat you if you are pregnant or think you are pregnant • Sexually transmitted diseases are not treated at the clinic.  ° ° °Dental Care: °Organization         Address  Phone  Notes  °[place] [facility] [address], [place] (336) 641-6152 Accepts children up to age 21 who are enrolled in Medicaid or [place] Health Choice; pregnant women with a Medicaid card; and children who have applied for Medicaid or [place] Health Choice, but were declined, whose parents can pay a reduced fee at time of service.  °[place]  [facility] [place]  [address], [place] (336) 641-7733 Accepts children up to age 21 who are enrolled in Medicaid or  Health Choice; pregnant women with a Medicaid card; and children who have applied for Medicaid or  Health Choice, but were declined, whose parents can pay a reduced fee at time of service.  °[facility] ° [address], [place] (336) 641-4533 Patients are seen by appointment only. Walk-ins are not accepted. [facility] will see patients 18 years of age and older. °Monday - Tuesday (8am-5pm) °Most Wednesdays (8:30-5pm) °$30 per visit, cash only  °[facility] ° [address], [place] (336) 641-4533 Patients are seen by appointment only. Walk-ins are not accepted. [facility] will see patients 18 years of age and older. °One   Wednesday Evening (Monthly: Volunteer Based).  $30 per visit, cash only  °[facility]  (919) 537-3737 for adults; Children under age 4, call Graduate Pediatric Dentistry at (919) [phone]. Children aged 4-14, please call (919) [phone] to request a pediatric application. ° Dental services are provided in all areas of dental care including fillings, crowns and bridges, complete and partial dentures, implants, gum treatment, root canals, and extractions. Preventive care is also provided. Treatment is provided to both adults and children. °Patients are selected via a lottery and there is often a waiting list. °  °[facility] [address], °[place] ° (336) 763-8833 [email or web address] °  °[facility] [address], [place], [place] (336)723-1848, Ext. 123 Second and Fourth Thursday of each month, opens at 6:30 AM; Clinic ends at 9 AM.  Patients are seen on a first-come first-served basis, and a limited number are seen during each clinic.  ° °[facility] ° [address], [place], [place] (336) 723-7904    Eligibility Requirements °You must have lived in [place], [place], or [place] counties for at least the last three months. °  You cannot be eligible for state or federal sponsored healthcare insurance, including Veterans Administration, Medicaid, or Medicare. °  You generally cannot be eligible for healthcare insurance through your employer.  °  How to apply: °Eligibility screenings are held every Tuesday and Wednesday afternoon from 1:00 pm until 4:00 pm. You do not need an appointment for the interview!  °[facility] [address], [place], [place] 336-631-2330   °[facility]  336-342-8273   °[facility]  336-703-3100   °[facility]  336-570-6415   ° °Behavioral Health Resources in the Community: °Intensive Outpatient Programs °Organization         Address  Phone  Notes  °[facility] [address], [place], [place] 336-878-6098   °[facility] [address], [place], [place] 336-832-9800   °[facility] [address], [place], [place] ° 336-882-2125   °[facility] [address],  °[place], [place] 1-800-853-5163 or 336-641-4981   °Substance Abuse Resources °Organization         Address  Phone  Notes  °[facility]  336-882-2125   °[facility]  336-784-9470   °[facility]  336-285-9073   °[facility]  336-845-3988   °Residential & Outpatient Substance Abuse Program  1-800-659-3381   °Psychological Services °Organization         Address  Phone  Notes  °[facility]  336- 832-9600   °[facility]  336- 378-7881   °[facility] [address], [place] 1-800-853-5163 or 336-641-4981   ° °Mobile Crisis Teams °Organization         Address  Phone  Notes  °[facility], Mobile Crisis Care Unit  1-877-626-1772   °Assertive °[facility] ° [address]  [place], [place] 336-834-9664   °Sharon DeEsch [address] ° [place] 336-554-5454   ° °Self-Help/Support Groups °Organization         Address  Phone             Notes  °[facility]  - variety of support groups  336- 373-1402 Call for more information  °Narcotics Anonymous (NA), [facility] [address], °[place]  2 meetings at this location  ° °  Residential Treatment Programs °Organization         Address  Phone  Notes  °[facility] [address],    °[place]  1-866-801-8205   °[facility] ° [address], [place], [place] 704-293-8524   °[facility] [address], [place] 336-845-3988 Admissions: 8am-3pm M-F  °[facility] [address]    °[place], [place] 336-841-1104   °[facility] [address], [place], [place] 336-379-7146   °[facility] [address]  °[place], [place] 336-285-9073   °[facility] - Intensive Outpatient [address], [place], [place] 336-852-3033   °[facility] [address]  °[place], [place] 1-877-615-2722 or 336-784-9470   °[facility] [address] [place], [place] 336-227-7417 Accepts Medicaid  °[facility] [address]  ° [place] 1-800-659-3381 Substance Abuse/Addiction Treatment  ° °[place] Behavioral Health Resources °Organization         Address  Phone  Notes  °[facility]  (888) 581-9988   °Julie Brannon, PhD [address] [place], [place]   (336) 349-5553 or (336) 951-0000   °[facility]   [address] °[place], [place] (336) 349-4454   °[facility] [address], [place], [place] (336) 342-8316 Insurance/Medicaid/sponsorship through [facility]  °[facility] [address]                                    [place], [place] (336) 342-8316 Therapy/tele-psych/case    °[facility] [address]  ° [place], [place] (336) 349-2233    °Dr. Arfeen  (336) 349-4544   °[facility] [place]  [employer] [place] [facility] 1) [address], [place] °2) [address], [place] °3)  [address], [place] (336) 349-3220 °(336) 342-7768 ° °(336) 342-8140   °[place] [facility] (336) 342-1394 or (336) 342-3537 (After Hours)    ° ° ° °Take your usual prescriptions as previously directed.  Call your regular medical doctor tomorrow to schedule a follow up appointment within the next 2 days. Return to the Emergency Department immediately sooner if worsening.  ° °

## 2014-04-05 NOTE — ED Notes (Signed)
Pt arrives in wheelchair, pt is anxious, tearful, shaking, pt states she woke up feeling weird, took BP medication  As directed, after taking BP at home BP and HR was elevated

## 2014-04-05 NOTE — ED Provider Notes (Signed)
CSN: [id number]     Arrival date & time 04/05/14  1236 History   First MD Initiated Contact with Patient 04/05/14 1444     Chief Complaint  Patient presents with  . Hypertension  . Anxiety     HPI Pt was seen at 1450. Per pt, c/o gradual onset and resolution of one episode of "high blood pressure" that occurred this morning after she woke up. Pt states she was "watching TV" when she "felt like my blood pressure was going up." Pt describes this feeling as "feeling weird." Pt states she borrowed a neighbor's BP machine and noted her BP was elevated. Pt states she then took her BP "10 more times over the next 2 or 3 hours" and "it was still high." Pt states her "heart rate was high too." Pt does endorse she "was feeling anxious" at the time. Pt then took her usual BP medication and came to the ED for evaluation. Pt states she is "starting to feel a little better now." Denies CP/palpitations, no SOB/cough, no abd pain, no N/V/D, no back pain, no syncope/near syncope, no focal motor weakness, no tingling/numbness in extremities.     Past Medical History  Diagnosis Date  . Hypertension    Past Surgical History  Procedure Laterality Date  . Cesarean section      History  Substance Use Topics  . Smoking status: Current Every Day Smoker -- 0.25 packs/day    Types: Cigarettes  . Smokeless tobacco: Not on file  . Alcohol Use: 1.2 oz/week    2 Shots of liquor per week     Comment: daily    Review of Systems ROS: Statement: All systems negative except as marked or noted in the HPI; Constitutional: Negative for fever and chills. ; ; Eyes: Negative for eye pain, redness and discharge. ; ; ENMT: Negative for ear pain, hoarseness, nasal congestion, sinus pressure and sore throat. ; ; Cardiovascular:  +"high blood pressure." Negative for chest pain, palpitations, diaphoresis, dyspnea and peripheral edema. ; ; Respiratory: Negative for cough, wheezing and stridor. ; ; Gastrointestinal: Negative for  nausea, vomiting, diarrhea, abdominal pain, blood in stool, hematemesis, jaundice and rectal bleeding. . ; ; Genitourinary: Negative for dysuria, flank pain and hematuria. ; ; Musculoskeletal: Negative for back pain and neck pain. Negative for swelling and trauma.; ; Skin: Negative for pruritus, rash, abrasions, blisters, bruising and skin lesion.; ; Neuro: Negative for headache, lightheadedness and neck stiffness. Negative for weakness, altered level of consciousness , altered mental status, extremity weakness, paresthesias, involuntary movement, seizure and syncope.; Psych:  +anxiety. No SI, no SA, no HI, no hallucinations.       Allergies  Review of patient's allergies indicates no known allergies.  Home Medications   Prior to Admission medications   Medication Sig Start Date End Date Taking? Authorizing Provider  bisoprolol-hydrochlorothiazide (ZIAC) 2.5-6.25 MG per tablet Take 1 tablet by mouth daily.   Yes Historical Provider, MD  FLUoxetine (PROZAC) 20 MG capsule Take 20 mg by mouth at bedtime.    Yes Historical Provider, MD   BP 132/91  Pulse 80  Temp(Src) 99.7 F (37.6 C) (Oral)  Resp 18  Ht  (1.727 m)  Wt 160 lb (72.576 kg)  BMI 24.33 kg/m2  SpO2 100% Filed Vitals:   04/05/14 1530 04/05/14 1545 04/05/14 1600 04/05/14 1615  BP: 139/90 125/74 133/86 132/91  Pulse: 73 79 74 80  Temp:      TempSrc:      Resp:  Height:      Weight:      SpO2: 100% 100% 100% 100%    Physical Exam 1455: Physical examination:  Nursing notes reviewed; Vital signs and O2 SAT reviewed;  Constitutional: Well developed, Well nourished, Well hydrated, In no acute distress; Head:  Normocephalic, atraumatic; Eyes: EOMI, PERRL, No scleral icterus; ENMT: Mouth and pharynx normal, Mucous membranes moist; Neck: Supple, Full range of motion, No lymphadenopathy; Cardiovascular: Regular rate and rhythm, No murmur, rub, or gallop; Respiratory: Breath sounds clear & equal bilaterally, No rales,  rhonchi, wheezes.  Speaking full sentences with ease, Normal respiratory effort/excursion; Chest: Nontender, Movement normal; Abdomen: Soft, Nontender, Nondistended, Normal bowel sounds; Genitourinary: No CVA tenderness; Extremities: Pulses normal, No tenderness, No edema, No calf edema or asymmetry.; Neuro: AA&Ox3, Major CN grossly intact.  Speech clear. No gross focal motor or sensory deficits in extremities.; Skin: Color normal, Warm, Dry.   ED Course  Procedures     EKG Interpretation   Date/Time:  Sunday April 05 2014 14:56:54 EDT Ventricular Rate:  68 PR Interval:  181 QRS Duration: 83 QT Interval:  390 QTC Calculation: 415 R Axis:   54 Text Interpretation:  Sinus rhythm Normal ECG No old tracing to compare  Confirmed by [facility]  MD, Nicholos Johns [phone]) on 04/05/2014 3:14:22 PM       MDM  MDM Reviewed: previous chart, nursing note and vitals Reviewed previous: labs Interpretation: labs, ECG and x-ray    Results for orders placed during the hospital encounter of 04/05/14  I-STAT CHEM 8, ED      Result Value Ref Range   Sodium 138  137 - 147 mEq/L   Potassium 4.0  3.7 - 5.3 mEq/L   Chloride 103  96 - 112 mEq/L   BUN 14  6 - 23 mg/dL   Creatinine, Ser 9.14  0.50 - 1.10 mg/dL   Glucose, Bld 91  70 - 99 mg/dL   Calcium, Ion 7.82  9.56 - 1.23 mmol/L   TCO2 34  0 - 100 mmol/L   Hemoglobin 14.3  12.0 - 15.0 g/dL   HCT 21.3  08.6 - 57.8 %  I-STAT TROPOININ, ED      Result Value Ref Range   Troponin i, poc 0.00  0.00 - 0.08 ng/mL   Comment 3            Dg Chest 2 View 04/05/2014   CLINICAL DATA:  Dizziness and weakness. Elevated blood pressure today. Family history of stroke.  EXAM: CHEST  2 VIEW  COMPARISON:  None.  FINDINGS: The heart size and mediastinal contours are within normal limits. Both lungs are clear. The visualized skeletal structures are unremarkable.  IMPRESSION: No active cardiopulmonary disease.   Electronically Signed   By: Rosalie Gums M.D.   On:  04/05/2014 16:12    1700:  Pt remains NSR, rate 70-80's while in the ED. BP stable. States she "feels better" and wants to go home now. Doubt PE as cause for symptoms with low risk [place]. Dx and testing d/w pt.  Questions answered.  Verb understanding, agreeable to d/c home with outpt f/u.    Samuel Jester, DO 04/08/14 Rickey Primus

## 2015-09-20 ENCOUNTER — Other Ambulatory Visit: Payer: Self-pay

## 2016-03-24 DIAGNOSIS — Z1231 Encounter for screening mammogram for malignant neoplasm of breast: Secondary | ICD-10-CM | POA: Diagnosis not present

## 2016-03-24 DIAGNOSIS — Z6824 Body mass index (BMI) 24.0-24.9, adult: Secondary | ICD-10-CM | POA: Diagnosis not present

## 2016-03-24 DIAGNOSIS — Z01419 Encounter for gynecological examination (general) (routine) without abnormal findings: Secondary | ICD-10-CM | POA: Diagnosis not present

## 2016-03-24 DIAGNOSIS — Z124 Encounter for screening for malignant neoplasm of cervix: Secondary | ICD-10-CM | POA: Diagnosis not present

## 2016-04-13 DIAGNOSIS — Z30430 Encounter for insertion of intrauterine contraceptive device: Secondary | ICD-10-CM | POA: Diagnosis not present

## 2016-04-13 DIAGNOSIS — Z30432 Encounter for removal of intrauterine contraceptive device: Secondary | ICD-10-CM | POA: Diagnosis not present

## 2016-04-13 DIAGNOSIS — Z113 Encounter for screening for infections with a predominantly sexual mode of transmission: Secondary | ICD-10-CM | POA: Diagnosis not present

## 2016-04-13 DIAGNOSIS — Z6824 Body mass index (BMI) 24.0-24.9, adult: Secondary | ICD-10-CM | POA: Diagnosis not present

## 2016-04-13 DIAGNOSIS — Z30431 Encounter for routine checking of intrauterine contraceptive device: Secondary | ICD-10-CM | POA: Diagnosis not present

## 2016-04-13 DIAGNOSIS — Z0000 Encounter for general adult medical examination without abnormal findings: Secondary | ICD-10-CM | POA: Diagnosis not present

## 2016-11-02 DIAGNOSIS — I10 Essential (primary) hypertension: Secondary | ICD-10-CM | POA: Diagnosis not present

## 2016-11-02 DIAGNOSIS — R0981 Nasal congestion: Secondary | ICD-10-CM | POA: Diagnosis not present

## 2016-11-02 DIAGNOSIS — Z713 Dietary counseling and surveillance: Secondary | ICD-10-CM | POA: Diagnosis not present

## 2016-11-02 DIAGNOSIS — J302 Other seasonal allergic rhinitis: Secondary | ICD-10-CM | POA: Diagnosis not present

## 2016-11-02 DIAGNOSIS — J069 Acute upper respiratory infection, unspecified: Secondary | ICD-10-CM | POA: Diagnosis not present

## 2016-12-11 ENCOUNTER — Other Ambulatory Visit: Payer: Self-pay

## 2016-12-11 ENCOUNTER — Emergency Department (HOSPITAL_COMMUNITY)
Admission: EM | Admit: 2016-12-11 | Discharge: 2016-12-11 | Disposition: A | Discharge status: Home / Self Care | Payer: BLUE CROSS/BLUE SHIELD | Attending: Emergency Medicine | Admitting: Emergency Medicine

## 2016-12-11 ENCOUNTER — Encounter (HOSPITAL_COMMUNITY): Payer: Self-pay | Admitting: *Deleted

## 2016-12-11 ENCOUNTER — Emergency Department (HOSPITAL_COMMUNITY): Payer: BLUE CROSS/BLUE SHIELD

## 2016-12-11 DIAGNOSIS — R002 Palpitations: Secondary | ICD-10-CM | POA: Insufficient documentation

## 2016-12-11 DIAGNOSIS — Z7982 Long term (current) use of aspirin: Secondary | ICD-10-CM | POA: Insufficient documentation

## 2016-12-11 DIAGNOSIS — I10 Essential (primary) hypertension: Secondary | ICD-10-CM | POA: Insufficient documentation

## 2016-12-11 DIAGNOSIS — R0789 Other chest pain: Secondary | ICD-10-CM | POA: Insufficient documentation

## 2016-12-11 DIAGNOSIS — Z79899 Other long term (current) drug therapy: Secondary | ICD-10-CM | POA: Insufficient documentation

## 2016-12-11 DIAGNOSIS — R079 Chest pain, unspecified: Secondary | ICD-10-CM | POA: Diagnosis not present

## 2016-12-11 DIAGNOSIS — F17210 Nicotine dependence, cigarettes, uncomplicated: Secondary | ICD-10-CM | POA: Diagnosis not present

## 2016-12-11 HISTORY — DX: Anxiety disorder, unspecified: F41.9

## 2016-12-11 LAB — CBC
HCT: 42.3 % (ref 36.0–46.0)
Hemoglobin: 15 g/dL (ref 12.0–15.0)
MCH: 32.8 pg (ref 26.0–34.0)
MCHC: 35.5 g/dL (ref 30.0–36.0)
MCV: 92.4 fL (ref 78.0–100.0)
PLATELETS: 190 10*3/uL (ref 150–400)
RBC: 4.58 MIL/uL (ref 3.87–5.11)
RDW: 12.6 % (ref 11.5–15.5)
WBC: 8.2 10*3/uL (ref 4.0–10.5)

## 2016-12-11 LAB — BASIC METABOLIC PANEL
Anion gap: 10 (ref 5–15)
BUN: 15 mg/dL (ref 6–20)
CALCIUM: 9.8 mg/dL (ref 8.9–10.3)
CHLORIDE: 99 mmol/L — AB (ref 101–111)
CO2: 26 mmol/L (ref 22–32)
CREATININE: 0.55 mg/dL (ref 0.44–1.00)
GFR calc non Af Amer: >60 mL/min (ref >60)
Glucose, Bld: 106 mg/dL — ABNORMAL HIGH (ref 65–99)
Potassium: 3.7 mmol/L (ref 3.5–5.1)
SODIUM: 135 mmol/L (ref 135–145)

## 2016-12-11 LAB — TROPONIN I

## 2016-12-11 LAB — TSH: TSH: 1.509 u[IU]/mL (ref 0.350–4.500)

## 2016-12-11 LAB — D-DIMER, QUANTITATIVE: D-Dimer, Quant: 0.33 ug/mL-FEU (ref 0.00–0.50)

## 2016-12-11 LAB — T4, FREE: FREE T4: 0.7 ng/dL (ref 0.61–1.12)

## 2016-12-11 NOTE — ED Provider Notes (Signed)
[facility]-EMER[facility]Y DEPT Provider Note   CSN: [id number] Arrival date & time: 12/11/16  1255     History   Chief Complaint Chief Complaint  Patient presents with  . Hypertension    HPI Carla Cohen is a 46 y.o. female.  HPI History presents with episodic palpitations, left-sided chest pain radiating to her left shoulder and left neck. Describes the pain as sharp and shooting like. Currently denies any pain. Had episode of shooting left-sided chest pain just prior to coming to the emergency department. Also described becoming flushed and lightheaded. Rapid palpitations and mild shortness of breath. She's had similar symptoms in the past. Recently started back on her blood pressure medicine. Denies any recent extended travel or immobilization. No new lower extremity swelling or pain. States she is feeling much better. Blood pressure is improving without treatment. Patient denies any new stressful life events. Smokes a 1/4 pack daily. Denies family history of coronary artery disease. Past Medical History:  Diagnosis Date  . Anxiety   . Hypertension     Patient Active Problem List   Diagnosis Date Noted  . Laceration of finger nail bed 08/20/2012    Past Surgical History:  Procedure Laterality Date  . CESAREAN SECTION      OB History    No data available       Home Medications    Prior to Admission medications   Medication Sig Start Date End Date Taking? Authorizing Provider  aspirin EC 81 MG tablet Take 162 mg by mouth once as needed for mild pain or moderate pain.   Yes [provider]  FLUoxetine (PROZAC) 40 MG capsule fluoxetine 40 mg capsule   Yes [provider]  levonorgestrel (MIRENA, 52 MG,) 20 MCG/24HR IUD Mirena 20 mcg/24 hr (5 years) intrauterine device  Take 1 device by intrauterine route.   Yes [provider]  lisinopril (PRINIVIL,ZESTRIL) 2.5 MG tablet Take 2.5 mg by mouth daily.   Yes [provider]    Family  History No family history on file.  Social History Social History  Substance Use Topics  . Smoking status: Current Every Day Smoker    Packs/day: 0.25    Types: Cigarettes  . Smokeless tobacco: Never Used  . Alcohol use 1.2 oz/week    2 Shots of liquor per week     Comment: daily     Allergies   Patient has no known allergies.   Review of Systems Review of Systems  Constitutional: Positive for diaphoresis. Negative for chills and fever.  HENT: Negative for congestion, drooling, sore throat and trouble swallowing.   Eyes: Negative for visual disturbance.  Respiratory: Positive for shortness of breath. Negative for cough and wheezing.   Cardiovascular: Positive for chest pain and palpitations. Negative for leg swelling.  Gastrointestinal: Negative for abdominal pain, diarrhea, nausea and vomiting.  Genitourinary: Negative for dysuria, flank pain, frequency, vaginal bleeding and vaginal discharge.  Musculoskeletal: Negative for back pain, joint swelling, myalgias, neck pain and neck stiffness.  Skin: Negative for rash and wound.  Neurological: Positive for light-headedness. Negative for dizziness, syncope, weakness, numbness and headaches.  Psychiatric/Behavioral: The patient is nervous/anxious.   All other systems reviewed and are negative.    Physical Exam Updated Vital Signs BP (!) 152/89 (BP Location: Left Arm)   Pulse 86   Temp 98.9 F (37.2 C) (Oral)   Resp 18   SpO2 100%   Physical Exam  Constitutional: She is oriented to person, place, and time. She appears  well-developed and well-nourished. No distress.  Mildly anxious  HENT:  Head: Normocephalic and atraumatic.  Mouth/Throat: Oropharynx is clear and moist.  Eyes: EOM are normal. Pupils are equal, round, and reactive to light.  Neck: Normal range of motion. Neck supple.  Cardiovascular: Normal rate and regular rhythm.  Exam reveals no gallop and no friction rub.   No murmur heard. Pulmonary/Chest: Effort  normal and breath sounds normal. No respiratory distress. She has no wheezes. She has no rales. She exhibits no tenderness.  Abdominal: Soft. Bowel sounds are normal. There is no tenderness. There is no rebound and no guarding.  Musculoskeletal: Normal range of motion. She exhibits no edema or tenderness.  No lower extremity swelling, asymmetry or tenderness.  Neurological: She is alert and oriented to person, place, and time.  Moving all extremities without focal deficit. Sensation fully intact.  Skin: Skin is warm and dry. Capillary refill takes less than 2 seconds. No rash noted. No erythema.  Psychiatric: Her behavior is normal.  Nursing note and vitals reviewed.    ED Treatments / Results  Labs (all labs ordered are listed, but only abnormal results are displayed) Labs Reviewed  BASIC METABOLIC PANEL - Abnormal; Notable for the following:       Result Value   Chloride 99 (*)    Glucose, Bld 106 (*)    All other components within normal limits  CBC  TROPONIN I  D-DIMER, QUANTITATIVE (NOT AT [facility])  TSH  T4, FREE  TROPONIN I    EKG  EKG Interpretation  Date/Time:  Monday December 11 2016 13:05:56 EDT Ventricular Rate:  100 PR Interval:  174 QRS Duration: 78 QT Interval:  334 QTC Calculation: 430 R Axis:   61 Text Interpretation:  Normal sinus rhythm Nonspecific ST abnormality Abnormal ECG Confirmed by Ranae PalmsYELVERTON  MD, Chantelle Verdi ([id number]) on 12/11/2016 5:11:37 PM       Radiology No results found.  Procedures Procedures (including critical care time)  Medications Ordered in ED Medications - No data to display   Initial Impression / Assessment and Plan / ED Course  I have reviewed the triage vital signs and the nursing notes.  Pertinent labs & imaging results that were available during my care of the patient were reviewed by me and considered in my medical decision making (see chart for details).    Patient left prior to completion of chest pain workup. Follow-up of lab  results revealed no abnormality. Troponin 2 is normal. Normal thyroid function test. Normal d-dimer. Unable to give follow-up or return precautions.   Final Clinical Impressions(s) / ED Diagnoses   Final diagnoses:  Hypertension, unspecified type  Intermittent palpitations  Atypical chest pain    New Prescriptions Discharge Medication List as of 12/11/2016  6:38 PM       Loren RacerYelverton, Court Gracia, MD 12/14/16 1313

## 2016-12-11 NOTE — ED Triage Notes (Signed)
Pt comes in from work. States she became hot and flushed and checked her blood pressure and had readings of 146/95 and 160/100 at work. She began having chest pains with pain into her left neck and left arm. Pt took two of her BP medications around 1200 today. Pt is alert and oriented. NAD noted and no unilateral weakness or speech problems noted.

## 2016-12-11 NOTE — Discharge Instructions (Signed)
History drinking plenty of water. Avoid caffeine and all stimulants. Follow-up closely with a cardiologist. Return immediately for any worsening of your symptoms or concerns.

## 2016-12-11 NOTE — ED Notes (Signed)
Triage nurse states pt said she was leaving and walked out of ed without getting d/c papers

## 2016-12-11 NOTE — ED Notes (Signed)
Pt updated on wait time.  

## 2016-12-12 DIAGNOSIS — Z6823 Body mass index (BMI) 23.0-23.9, adult: Secondary | ICD-10-CM | POA: Diagnosis not present

## 2016-12-12 DIAGNOSIS — E669 Obesity, unspecified: Secondary | ICD-10-CM | POA: Diagnosis not present

## 2016-12-12 DIAGNOSIS — Z1389 Encounter for screening for other disorder: Secondary | ICD-10-CM | POA: Diagnosis not present

## 2016-12-12 DIAGNOSIS — I10 Essential (primary) hypertension: Secondary | ICD-10-CM | POA: Diagnosis not present

## 2017-01-02 NOTE — Progress Notes (Deleted)
Cardiology Office Note   Date:  01/02/2017   ID:  Carla Cohen, DOB 1970/12/28, MRN [id number]  PCP:  [facility], [facility]  Cardiologist:   Charlton HawsPeter Naethan Bracewell, MD   No chief complaint on file.     History of Present Illness: Carla Cohen is a 46 y.o. female who presents for consultation regarding chest pain Referred by Dr Laddie AquasHall [facility] Seen in ED 12/11/16 with episodic palpitations, left sided chest pain radiating to shoulder and neck. Sharp stabbing pain. Flushed with rapid palpitations and dyspnea. BP meds only started recently. History of anxiety and HTN.  She is a current daily smoker  She r/o labs including TSH Ok  CXR NAD and ECG non acute     Past Medical History:  Diagnosis Date  . Anxiety   . HTN (hypertension)   . Hypertension   . Obesity     Past Surgical History:  Procedure Laterality Date  . CESAREAN SECTION       Current Outpatient Prescriptions  Medication Sig Dispense Refill  . amLODipine (NORVASC) 5 MG tablet Take 5 mg by mouth daily.    Marland Kitchen. aspirin EC 81 MG tablet Take 162 mg by mouth once as needed for mild pain or moderate pain.    Marland Kitchen. FLUoxetine (PROZAC) 40 MG capsule fluoxetine 40 mg capsule    . levonorgestrel (MIRENA, 52 MG,) 20 MCG/24HR IUD Mirena 20 mcg/24 hr (5 years) intrauterine device  Take 1 device by intrauterine route.     No current facility-administered medications for this visit.     Allergies:   Penicillins    Social History:  The patient  reports that she has been smoking Cigarettes.  She has been smoking about 0.25 packs per day. She has never used smokeless tobacco. She reports that she drinks about 1.2 oz of alcohol per week . She reports that she does not use drugs.   Family History:  The patient's family history is not on file.    ROS:  Please see the history of present illness.   Otherwise, review of systems are positive for {NONE DEFAULTED:18576::"none"}.   All other systems are reviewed and negative.     PHYSICAL EXAM: VS:  There were no vitals taken for this visit. , BMI There is no height or weight on file to calculate BMI. Affect appropriate Healthy:  appears stated age HEENT: normal Neck supple with no adenopathy JVP normal no bruits no thyromegaly Lungs clear with no wheezing and good diaphragmatic motion Heart:  S1/S2 no murmur, no rub, gallop or click PMI normal Abdomen: benighn, BS positve, no tenderness, no AAA no bruit.  No HSM or HJR Distal pulses intact with no bruits No edema Neuro non-focal Skin warm and dry No muscular weakness    EKG:  ST rate 100 nonspecific ST changes 12/12/16   Recent Labs: 12/11/2016: BUN 15; Creatinine, Ser 0.55; Hemoglobin 15.0; Platelets 190; Potassium 3.7; Sodium 135; TSH 1.509    Lipid Panel No results found for: CHOL, TRIG, HDL, CHOLHDL, VLDL, LDLCALC, LDLDIRECT    Wt Readings from Last 3 Encounters:  04/05/14 72.6 kg (160 lb)  07/11/13 73.9 kg (163 lb)  08/26/12 72.6 kg (160 lb)      Other studies Reviewed: Additional studies/ records that were reviewed today include: Notes [facility] ER notes labs CXR and ECG .    ASSESSMENT AND PLAN:  1.  Chest Pain 2. Palpitations 3. Dyspnea 4. Smoking 5. HTN 6. Anxiety  Current medicines are reviewed at length with the patient today.  The patient {ACTIONS; HAS/DOES NOT HAVE:19233} concerns regarding medicines.  The following changes have been made:  {PLAN; NO CHANGE:13088:s}  Labs/ tests ordered today include: *** No orders of the defined types were placed in this encounter.    Disposition:   FU with ***     Signed, Charlton Haws, MD  01/02/2017 8:54 AM    [facility] [address] [place], [place], [place]  [address] Phone: [phone]; Fax: [phone]

## 2017-01-03 ENCOUNTER — Encounter: Payer: Self-pay | Admitting: *Deleted

## 2017-01-04 ENCOUNTER — Ambulatory Visit: Payer: BLUE CROSS/BLUE SHIELD | Admitting: Cardiovascular Disease

## 2017-02-09 NOTE — Progress Notes (Signed)
Cardiology Office Note   Date:  02/12/2017   ID:  Carla Cohen, DOB 17-Feb-1971, MRN [id number]  PCP:  [facility], [facility]  Cardiologist:   Charlton HawsPeter Verner Kopischke, MD   No chief complaint on file.     History of Present Illness: Carla Cohen is a 46 y.o. female who presents for consultation regarding HTN and SSCP that radiates to left arm [facility] Margo AyeHall Seen in ER 12/11/16  Describes the pain as sharp and shooting like. Currently denies any pain. Had episode of shooting left-sided chest pain just prior to coming to the emergency department. Also described becoming flushed and lightheaded. Rapid palpitations and mild shortness of breath. She's had similar symptoms in the past. Recently started back on her blood pressure medicine. Denies any recent extended travel or immobilization. No new lower extremity swelling or pain. States she is feeling much better. Blood pressure is improving without treatment. Patient denies any new stressful life events. Smokes a 1/4 pack daily. Denies family history of coronary artery disease. R/O d dimer negative CXR NAD BP improved spontaneously while in ER but she left before d/c instructions given   She is divorced Has an 46 yo son She is an alcoholic and drinks beer daily. She has gone to [employer] meetings but nothing Regular. 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 No recurrent SSCP since on BP meds  Past Medical History:  Diagnosis Date  . Anxiety   . HTN (hypertension)   . HTN (hypertension)   . Hypertension   . Obesity     Past Surgical History:  Procedure Laterality Date  . CESAREAN SECTION       Current Outpatient Prescriptions  Medication Sig Dispense Refill  . amLODipine (NORVASC) 5 MG tablet Take 5 mg by mouth daily.    Marland Kitchen. aspirin EC 81 MG tablet Take 81 mg by mouth daily.     Marland Kitchen. FLUoxetine (PROZAC) 40 MG capsule fluoxetine 40 mg capsule     . levonorgestrel (MIRENA, 52 MG,) 20 MCG/24HR IUD Mirena 20 mcg/24 hr (5 years) intrauterine device  Take 1 device by intrauterine route.     No current facility-administered medications for this visit.     Allergies:   Penicillins    Social History:  The patient  reports that she has been smoking Cigarettes.  She started smoking about 26 years ago. She has been smoking about 0.25 packs per day. She has never used smokeless tobacco. She reports that she drinks about 1.2 oz of alcohol per week . She reports that she does not use drugs.   Family History:  The patient's family history includes Aneurysm in her maternal grandfather; Breast cancer in her maternal grandmother; Liver cancer in her paternal grandmother; Prostate cancer in her paternal grandfather; Stroke in her maternal grandmother and mother.    ROS:  Please see the history of present illness.   Otherwise, review of systems are positive for none.   All other systems are reviewed and negative.    PHYSICAL EXAM: VS:  BP 140/80   Pulse 81   Ht 5\' 9"  (1.753 m)   Wt 161 lb 6.4 oz (73.2 kg)   SpO2 98% Comment: on room air  BMI 23.83 kg/m  , BMI Body mass index is 23.83 kg/m. Affect appropriate Healthy:  appears stated age HEENT: normal Neck supple with no adenopathy JVP normal no bruits no  thyromegaly Lungs clear with no wheezing and good diaphragmatic motion Heart:  S1/S2 no murmur, no rub, gallop or click PMI normal Abdomen: benighn, BS positve, no tenderness, no AAA no bruit.  No HSM or HJR Distal pulses intact with no bruits No edema Neuro non-focal Skin warm and dry No muscular weakness    EKG:  SR rate 100 nonspecific ST/T wave chagnes    Recent Labs: 12/11/2016: BUN 15; Creatinine, Ser 0.55; Hemoglobin 15.0; Platelets 190; Potassium 3.7; Sodium 135; TSH 1.509    Lipid Panel No results found for: CHOL, TRIG, HDL, CHOLHDL, VLDL, LDLCALC, LDLDIRECT    Wt Readings from Last 3 Encounters:  02/12/17 161  lb 6.4 oz (73.2 kg)  12/12/16 155 lb (70.3 kg)  04/05/14 160 lb (72.6 kg)      Other studies Reviewed: Additional studies/ records that were reviewed today include: ER notes notes primary ECG labs and CXR ER visit 12/12/16.    ASSESSMENT AND PLAN:  1.  Chest Pain resolved f/u ETT  2. HTN: improved on Norvasc will see BP/HR response to ETT. Discussed relationship ETOH to BP contorl 3. Depression continue prosac 4. ETOH:  Encouraged her to seek professional help and continue to go to AA on regular basis long term risk liver Disease and cirrhosis also discussed    Current medicines are reviewed at length with the patient today.  The patient does not have concerns regarding medicines.  The following changes have been made:  no change  Labs/ tests ordered today include: ETT   Orders Placed This Encounter  Procedures  . Exercise Tolerance Test     Disposition:   FU with cardiology PRN      Signed, Charlton HawsPeter Tnia Anglada, MD  02/12/2017 10:17 AM    [facility] [address], [place], [place]  [address] Phone: [phone]; Fax: [phone]

## 2017-02-12 ENCOUNTER — Encounter: Payer: Self-pay | Admitting: Cardiovascular Disease

## 2017-02-12 ENCOUNTER — Ambulatory Visit (INDEPENDENT_AMBULATORY_CARE_PROVIDER_SITE_OTHER): Payer: BLUE CROSS/BLUE SHIELD | Admitting: Cardiovascular Disease

## 2017-02-12 VITALS — BP 140/80 | HR 81 | Ht 69.0 in | Wt 161.4 lb

## 2017-02-12 DIAGNOSIS — R079 Chest pain, unspecified: Secondary | ICD-10-CM

## 2017-02-12 NOTE — Patient Instructions (Signed)
Medication Instructions:  Your physician recommends that you continue on your current medications as directed. Please refer to the Current Medication list given to you today.   Labwork: NONE  Testing/Procedures: Your physician has requested that you have an exercise tolerance test. For further information please visit [email or web address]. Please also follow instruction sheet, as given.    Follow-Up: Your physician recommends that you schedule a follow-up appointment in: As Needed    Any Other Special Instructions Will Be Listed Below (If Applicable).     If you need a refill on your cardiac medications before your next appointment, please call your pharmacy.  Thank you for choosing [facility]!

## 2017-03-13 ENCOUNTER — Inpatient Hospital Stay (HOSPITAL_COMMUNITY): Admission: RE | Admit: 2017-03-13 | Payer: BLUE CROSS/BLUE SHIELD | Source: Ambulatory Visit

## 2017-05-11 DIAGNOSIS — Z01419 Encounter for gynecological examination (general) (routine) without abnormal findings: Secondary | ICD-10-CM | POA: Diagnosis not present

## 2017-05-11 DIAGNOSIS — Z1231 Encounter for screening mammogram for malignant neoplasm of breast: Secondary | ICD-10-CM | POA: Diagnosis not present

## 2017-05-11 DIAGNOSIS — Z124 Encounter for screening for malignant neoplasm of cervix: Secondary | ICD-10-CM | POA: Diagnosis not present

## 2017-05-11 DIAGNOSIS — Z6824 Body mass index (BMI) 24.0-24.9, adult: Secondary | ICD-10-CM | POA: Diagnosis not present

## 2017-05-15 ENCOUNTER — Other Ambulatory Visit: Payer: Self-pay | Admitting: Obstetrics and Gynecology

## 2017-05-15 DIAGNOSIS — R928 Other abnormal and inconclusive findings on diagnostic imaging of breast: Secondary | ICD-10-CM

## 2017-05-23 ENCOUNTER — Ambulatory Visit
Admission: RE | Admit: 2017-05-23 | Discharge: 2017-05-23 | Disposition: A | Discharge status: Home / Self Care | Payer: BLUE CROSS/BLUE SHIELD | Source: Ambulatory Visit | Attending: Obstetrics and Gynecology | Admitting: Obstetrics and Gynecology

## 2017-05-23 DIAGNOSIS — N6489 Other specified disorders of breast: Secondary | ICD-10-CM | POA: Diagnosis not present

## 2017-05-23 DIAGNOSIS — R922 Inconclusive mammogram: Secondary | ICD-10-CM | POA: Diagnosis not present

## 2017-05-23 DIAGNOSIS — R928 Other abnormal and inconclusive findings on diagnostic imaging of breast: Secondary | ICD-10-CM

## 2017-05-24 DIAGNOSIS — F064 Anxiety disorder due to known physiological condition: Secondary | ICD-10-CM | POA: Diagnosis not present

## 2017-05-24 DIAGNOSIS — Z23 Encounter for immunization: Secondary | ICD-10-CM | POA: Diagnosis not present

## 2017-05-24 DIAGNOSIS — Z6825 Body mass index (BMI) 25.0-25.9, adult: Secondary | ICD-10-CM | POA: Diagnosis not present

## 2017-05-24 DIAGNOSIS — I10 Essential (primary) hypertension: Secondary | ICD-10-CM | POA: Diagnosis not present

## 2017-05-29 DIAGNOSIS — I10 Essential (primary) hypertension: Secondary | ICD-10-CM | POA: Diagnosis not present

## 2017-06-07 DIAGNOSIS — Z0000 Encounter for general adult medical examination without abnormal findings: Secondary | ICD-10-CM | POA: Diagnosis not present

## 2017-06-25 DIAGNOSIS — H04121 Dry eye syndrome of right lacrimal gland: Secondary | ICD-10-CM | POA: Diagnosis not present

## 2017-06-25 DIAGNOSIS — H04122 Dry eye syndrome of left lacrimal gland: Secondary | ICD-10-CM | POA: Diagnosis not present

## 2017-08-07 DIAGNOSIS — H04123 Dry eye syndrome of bilateral lacrimal glands: Secondary | ICD-10-CM | POA: Diagnosis not present

## 2017-08-07 DIAGNOSIS — H16223 Keratoconjunctivitis sicca, not specified as Sjogren's, bilateral: Secondary | ICD-10-CM | POA: Diagnosis not present

## 2017-08-27 DIAGNOSIS — H6692 Otitis media, unspecified, left ear: Secondary | ICD-10-CM | POA: Diagnosis not present

## 2018-07-25 IMAGING — MG 2D DIGITAL DIAGNOSTIC UNILATERAL LEFT MAMMOGRAM WITH CAD AND ADJ
6 series · 6 of 14 positions shown · non-contrast
Comparison: Previous exam(s).

CLINICAL DATA: Screening recall for a possible asymmetry in the
left breast.

EXAM:
2D DIGITAL DIAGNOSTIC LEFT MAMMOGRAM WITH CAD AND ADJUNCT TOMO
ULTRASOUND LEFT BREAST

[L MLO]
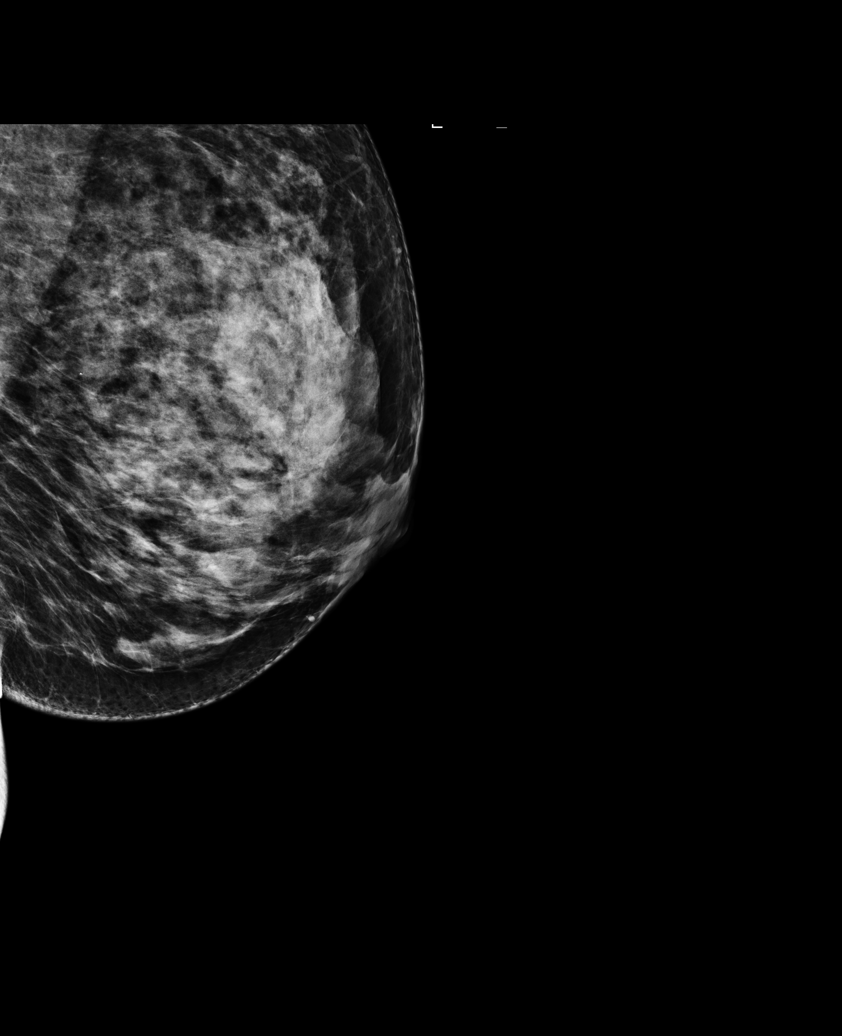

[L CC]
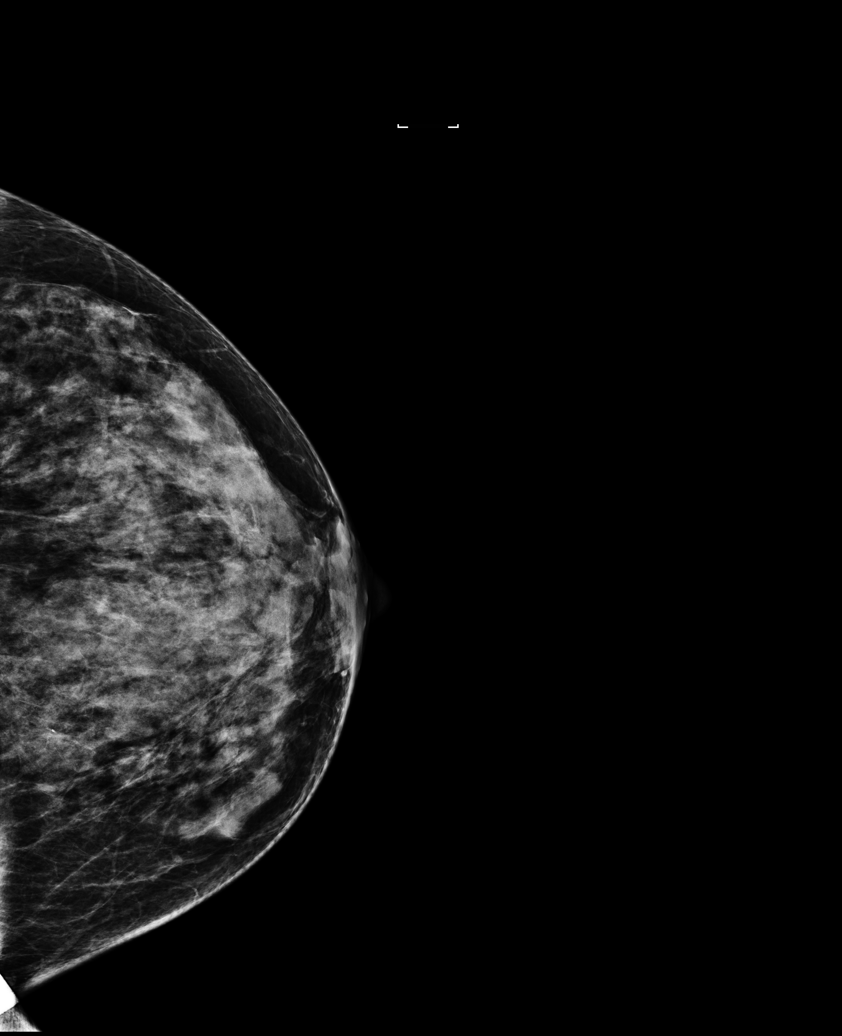

[L CC synth-2D]
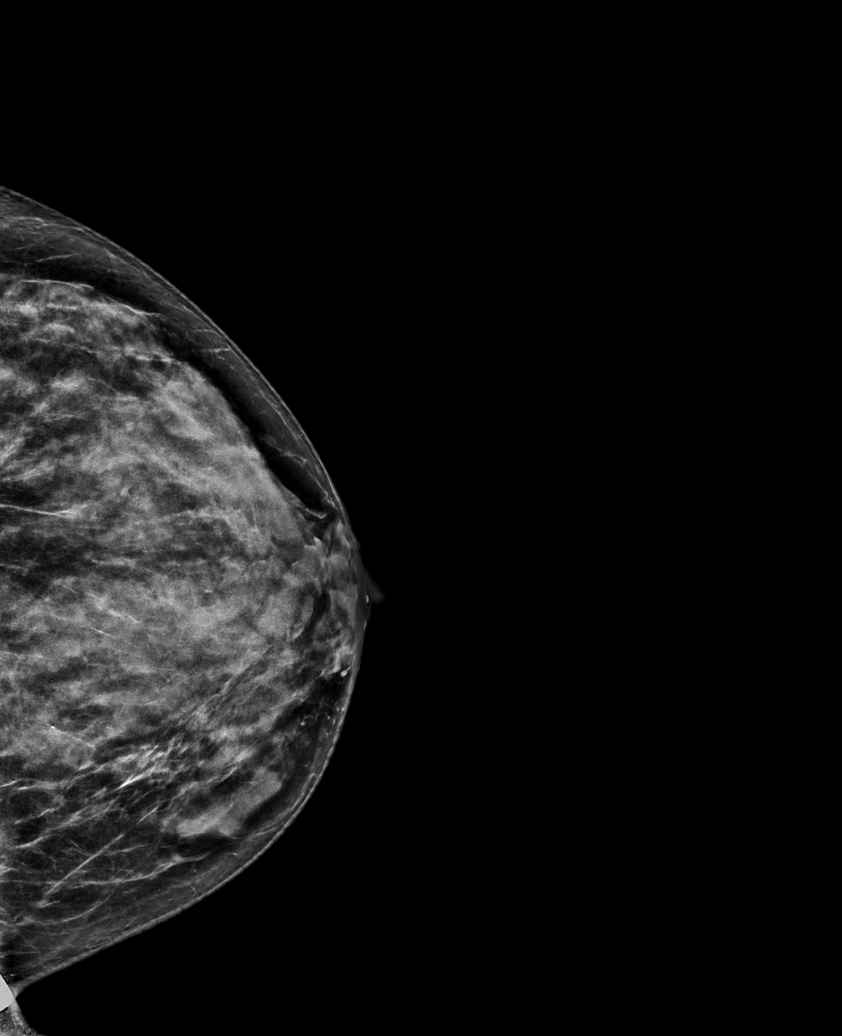

[L MLO synth-2D]
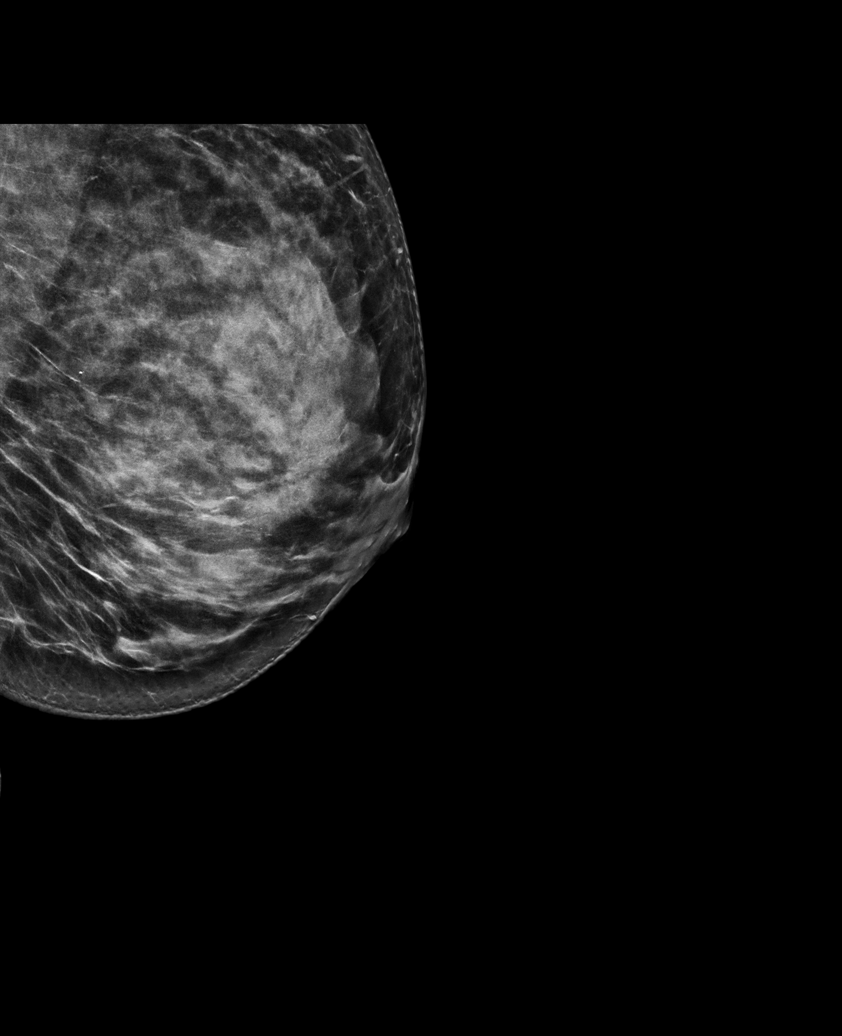

[L CC tomo · tomo slice 35/69.0]
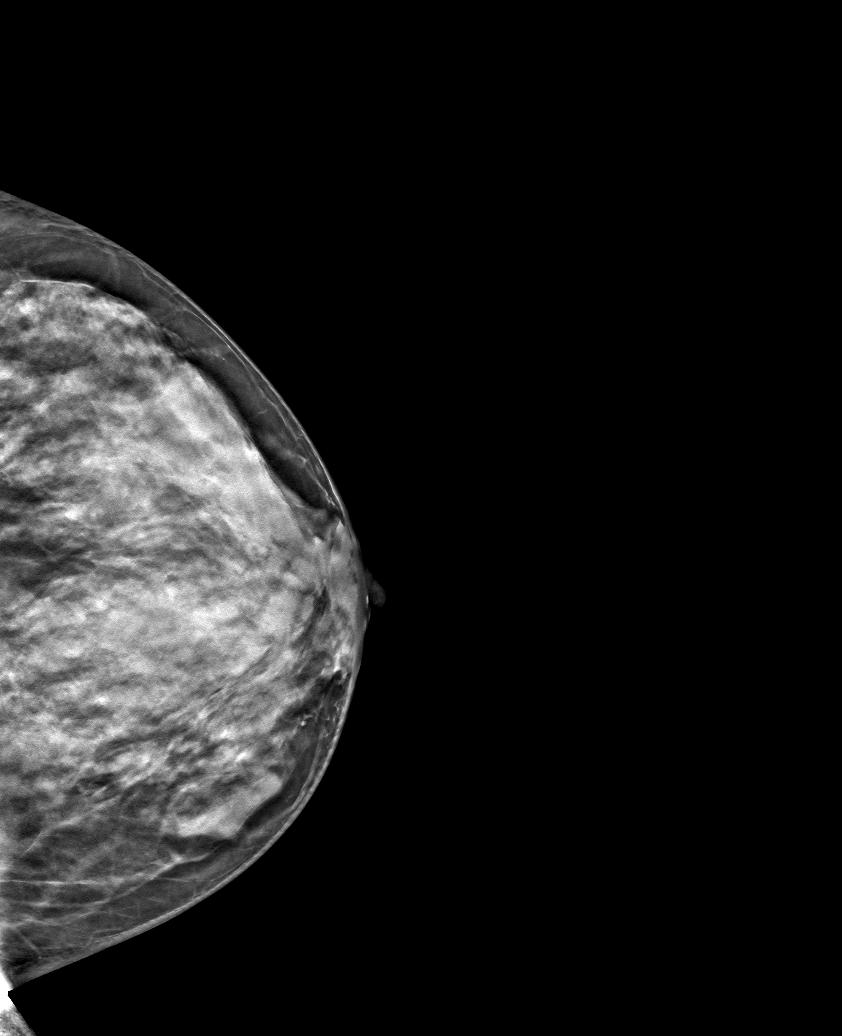

[L MLO tomo · tomo slice 37/74.0]
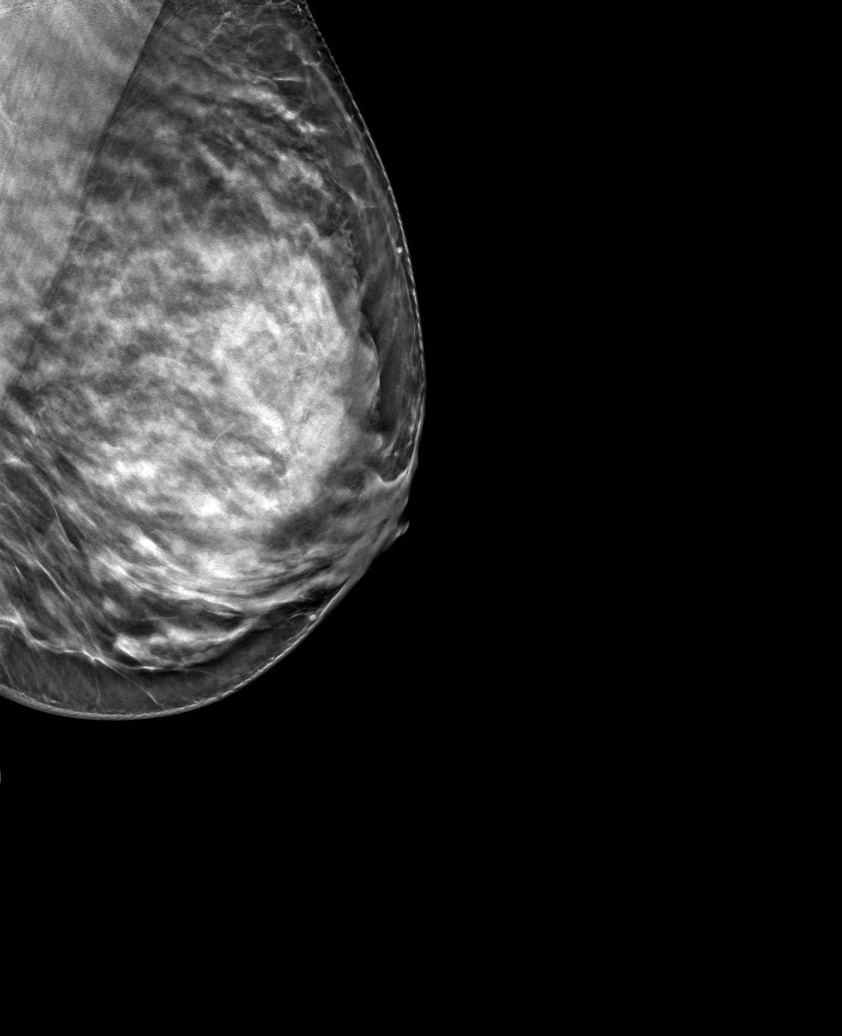

[6 of 14 positions shown; findings below may reference images not displayed]

ACR Breast Density Category c: The breast tissue is heterogeneously
dense, which may obscure small masses.
FINDINGS: The area of possible asymmetry appears as an area of fibroglandular
tissue on the diagnostic 3D imaging. It also appears stable from
prior exams. There is no discrete mass. There are no areas of
architectural distortion. There are no suspicious calcifications.

Mammographic images were processed with CAD.

On physical exam, no mass is palpated along the lower inner left
breast.

Targeted ultrasound is performed, showing heterogeneous
fibroglandular tissue throughout the lower inner left breast. No
mass or suspicious lesion.
IMPRESSION: Negative exam.  No evidence of breast malignancy.

RECOMMENDATION:
Screening mammogram in one year.(Code:[id number])

I have discussed the findings and recommendations with the patient.
Results were also provided in writing at the conclusion of the
visit. If applicable, a reminder letter will be sent to the patient
regarding the next appointment.

BI-RADS CATEGORY  1: Negative.

## 2018-08-07 DIAGNOSIS — M545 Low back pain: Secondary | ICD-10-CM | POA: Diagnosis not present

## 2019-06-12 ENCOUNTER — Other Ambulatory Visit: Payer: Self-pay

## 2019-06-12 DIAGNOSIS — Z20822 Contact with and (suspected) exposure to covid-19: Secondary | ICD-10-CM

## 2019-06-13 LAB — NOVEL CORONAVIRUS, NAA: SARS-CoV-2, NAA: NOT DETECTED

## 2019-07-21 ENCOUNTER — Ambulatory Visit: Payer: BC Managed Care – PPO | Attending: Internal Medicine

## 2019-07-21 ENCOUNTER — Other Ambulatory Visit: Payer: Self-pay

## 2019-07-21 DIAGNOSIS — Z20822 Contact with and (suspected) exposure to covid-19: Secondary | ICD-10-CM

## 2019-07-23 LAB — NOVEL CORONAVIRUS, NAA: SARS-CoV-2, NAA: NOT DETECTED

## 2019-12-18 DIAGNOSIS — Z01419 Encounter for gynecological examination (general) (routine) without abnormal findings: Secondary | ICD-10-CM | POA: Diagnosis not present

## 2019-12-18 DIAGNOSIS — Z1231 Encounter for screening mammogram for malignant neoplasm of breast: Secondary | ICD-10-CM | POA: Diagnosis not present

## 2019-12-18 DIAGNOSIS — Z8742 Personal history of other diseases of the female genital tract: Secondary | ICD-10-CM | POA: Diagnosis not present

## 2019-12-18 DIAGNOSIS — Z6823 Body mass index (BMI) 23.0-23.9, adult: Secondary | ICD-10-CM | POA: Diagnosis not present

## 2020-01-23 DIAGNOSIS — G5600 Carpal tunnel syndrome, unspecified upper limb: Secondary | ICD-10-CM | POA: Diagnosis not present

## 2020-03-04 DIAGNOSIS — G5601 Carpal tunnel syndrome, right upper limb: Secondary | ICD-10-CM | POA: Diagnosis not present

## 2020-03-04 DIAGNOSIS — M5412 Radiculopathy, cervical region: Secondary | ICD-10-CM | POA: Diagnosis not present

## 2020-03-18 ENCOUNTER — Other Ambulatory Visit: Payer: Self-pay | Admitting: Orthopedic Surgery

## 2020-03-22 ENCOUNTER — Other Ambulatory Visit: Payer: Self-pay | Admitting: Orthopedic Surgery

## 2020-03-22 DIAGNOSIS — M542 Cervicalgia: Secondary | ICD-10-CM

## 2020-04-10 ENCOUNTER — Other Ambulatory Visit: Payer: BC Managed Care – PPO

## 2020-10-13 DIAGNOSIS — I10 Essential (primary) hypertension: Secondary | ICD-10-CM | POA: Diagnosis not present

## 2020-10-14 DIAGNOSIS — M5412 Radiculopathy, cervical region: Secondary | ICD-10-CM | POA: Diagnosis not present

## 2020-10-14 DIAGNOSIS — G5601 Carpal tunnel syndrome, right upper limb: Secondary | ICD-10-CM | POA: Diagnosis not present

## 2020-10-14 DIAGNOSIS — G5602 Carpal tunnel syndrome, left upper limb: Secondary | ICD-10-CM | POA: Diagnosis not present

## 2020-11-01 DIAGNOSIS — F064 Anxiety disorder due to known physiological condition: Secondary | ICD-10-CM | POA: Diagnosis not present

## 2020-11-01 DIAGNOSIS — Z23 Encounter for immunization: Secondary | ICD-10-CM | POA: Diagnosis not present

## 2020-11-01 DIAGNOSIS — Z6825 Body mass index (BMI) 25.0-25.9, adult: Secondary | ICD-10-CM | POA: Diagnosis not present

## 2020-11-01 DIAGNOSIS — I10 Essential (primary) hypertension: Secondary | ICD-10-CM | POA: Diagnosis not present

## 2020-11-10 DIAGNOSIS — I10 Essential (primary) hypertension: Secondary | ICD-10-CM | POA: Diagnosis not present

## 2020-11-10 DIAGNOSIS — E785 Hyperlipidemia, unspecified: Secondary | ICD-10-CM | POA: Diagnosis not present

## 2020-11-10 DIAGNOSIS — M7989 Other specified soft tissue disorders: Secondary | ICD-10-CM | POA: Diagnosis not present

## 2020-12-23 ENCOUNTER — Encounter: Payer: Self-pay | Admitting: Cardiology

## 2020-12-23 ENCOUNTER — Other Ambulatory Visit: Payer: Self-pay

## 2020-12-23 ENCOUNTER — Ambulatory Visit (INDEPENDENT_AMBULATORY_CARE_PROVIDER_SITE_OTHER): Payer: BC Managed Care – PPO | Admitting: Cardiology

## 2020-12-23 VITALS — BP 126/72 | HR 87 | Ht 68.0 in | Wt 159.0 lb

## 2020-12-23 DIAGNOSIS — F17200 Nicotine dependence, unspecified, uncomplicated: Secondary | ICD-10-CM

## 2020-12-23 DIAGNOSIS — R9431 Abnormal electrocardiogram [ECG] [EKG]: Secondary | ICD-10-CM | POA: Diagnosis not present

## 2020-12-23 DIAGNOSIS — I10 Essential (primary) hypertension: Secondary | ICD-10-CM | POA: Diagnosis not present

## 2020-12-23 NOTE — Patient Instructions (Addendum)
Medication Instructions:  Your physician recommends that you continue on your current medications as directed. Please refer to the Current Medication list given to you today.  *If you need a refill on your cardiac medications before your next appointment, please call your pharmacy*   Lab Work: None ordered If you have labs (blood work) drawn today and your tests are completely normal, you will receive your results only by: MyChart Message (if you have MyChart) OR A paper copy in the mail If you have any lab test that is abnormal or we need to change your treatment, we will call you to review the results.   Testing/Procedures:  Your physician has requested that you have an echocardiogram. Echocardiography is a painless test that uses sound waves to create images of your heart. It provides your doctor with information about the size and shape of your heart and how well your heart's chambers and valves are working. This procedure takes approximately one hour. There are no restrictions for this procedure.     Follow-Up: At [facility], you and your health needs are our priority.  As part of our continuing mission to provide you with exceptional heart care, we have created designated Provider Care Teams.  These Care Teams include your primary Cardiologist (physician) and Advanced Practice Providers (APPs -  Physician Assistants and Nurse Practitioners) who all work together to provide you with the care you need, when you need it.  We recommend signing up for the patient portal called "MyChart".  Sign up information is provided on this After Visit Summary.  MyChart is used to connect with patients for Virtual Visits (Telemedicine).  Patients are able to view lab/test results, encounter notes, upcoming appointments, etc.  Non-urgent messages can be sent to your provider as well.   To learn more about what you can do with MyChart, go to [email or web address].    Your next appointment:    Follow up after Echo   The format for your next appointment:   In Person  Provider:   You may see Debbe Odea, MD or one of the following Advanced Practice Providers on your designated Care Team:   Nicolasa Ducking, NP Eula Listen, PA-C Marisue Ivan, PA-C Cadence Makena, [place] Gillian Shields, NP   Other Instructions  Other Instructions

## 2020-12-23 NOTE — Progress Notes (Signed)
Cardiology Office Note:    Date:  12/23/2020   ID:  Carla Cohen, DOB July 25, 1970, MRN [id number]  PCP:  [facility]   [facility] Providers Cardiologist:  Debbe Odea, MD     Referring MD: Benita Stabile, MD   Chief Complaint  Patient presents with   New Patient (Initial Visit)    Referred by PCP for Tachycardia and Abnormal EKG. Patient c.o swelling in ankles, Meds reviewed verbally with patient.    Carla Cohen is a 50 y.o. female who is being seen today for the evaluation of edema, abnormal EKG at the request of Benita Stabile, MD.   History of Present Illness:    Carla Cohen is a 50 y.o. female with a hx of hypertension, anxiety, current smoker x10 years who presents due to abnormal EKG and swelling in ankles.  She saw her primary care provider for scheduled visit where an EKG obtained was noted to be abnormal.  She was told she might of had an old heart attack.  Denies symptoms of chest pain, shortness of breath, any prior cardiac history.  States having some swelling in her legs.  Has been on amlodipine for years, recently started on Toprol-XL for both tachycardia and elevated blood pressure.  Her blood pressures have been well controlled at home.  Has no other cardiac symptoms.  Past Medical History:  Diagnosis Date   Anxiety    HTN (hypertension)    HTN (hypertension)    Hypertension    Obesity     Past Surgical History:  Procedure Laterality Date   CESAREAN SECTION      Current Medications: Current Meds  Medication Sig   amLODipine (NORVASC) 10 MG tablet Take 10 mg by mouth daily.   aspirin EC 81 MG tablet Take 81 mg by mouth daily.   FLUoxetine (PROZAC) 40 MG capsule fluoxetine 40 mg capsule   levonorgestrel (MIRENA) 20 MCG/24HR IUD Mirena 20 mcg/24 hr (5 years) intrauterine device  Take 1 device by intrauterine route.   metoprolol succinate (TOPROL-XL) 25 MG 24 hr tablet Take 1 tablet by mouth daily.     Allergies:    Penicillins   Social History   Socioeconomic History   Marital status: Legally Separated    Spouse name: Not on file   Number of children: Not on file   Years of education: Not on file   Highest education level: Not on file  Occupational History   Not on file  Tobacco Use   Smoking status: Every Day    Packs/day: 0.25    Pack years: 0.00    Types: Cigarettes    Start date: 02/19/1991   Smokeless tobacco: Never  Substance and Sexual Activity   Alcohol use: Yes    Alcohol/week: 2.0 standard drinks    Types: 2 Shots of liquor per week    Comment: daily   Drug use: No   Sexual activity: Yes    Birth control/protection: Implant  Other Topics Concern   Not on file  Social History Narrative   Not on file   Social Determinants of Health   Financial Resource Strain: Not on file  Food Insecurity: Not on file  Transportation Needs: Not on file  Physical Activity: Not on file  Stress: Not on file  Social Connections: Not on file     Family History: The patient's family history includes Aneurysm in her maternal grandfather; Breast cancer in her maternal grandmother; Liver cancer in her  paternal grandmother; Prostate cancer in her paternal grandfather; Stroke in her maternal grandmother and mother.  ROS:   Please see the history of present illness.     All other systems reviewed and are negative.  EKGs/Labs/Other Studies Reviewed:    The following studies were reviewed today:   EKG:  EKG is  ordered today.  The ekg ordered today demonstrates normal sinus rhythm, possible old septal infarct.  Recent Labs: No results found for requested labs within last 8760 hours.  Recent Lipid Panel No results found for: CHOL, TRIG, HDL, CHOLHDL, VLDL, LDLCALC, LDLDIRECT   Risk Assessment/Calculations:      Physical Exam:    VS:  BP 126/72 (BP Location: Left Arm, Patient Position: Sitting, Cuff Size: Normal)   Pulse 87   Ht 5\' 8"  (1.727 m)   Wt 159 lb (72.1 kg)   SpO2 98%    BMI 24.18 kg/m     Wt Readings from Last 3 Encounters:  12/23/20 159 lb (72.1 kg)  02/12/17 161 lb 6.4 oz (73.2 kg)  12/12/16 155 lb (70.3 kg)     GEN:  Well nourished, well developed in no acute distress HEENT: Normal NECK: No JVD; No carotid bruits LYMPHATICS: No lymphadenopathy CARDIAC: RRR, no murmurs, rubs, gallops RESPIRATORY:  Clear to auscultation without rales, wheezing or rhonchi  ABDOMEN: Soft, non-tender, non-distended MUSCULOSKELETAL:  No edema; No deformity  SKIN: Warm and dry NEUROLOGIC:  Alert and oriented x 3 PSYCHIATRIC:  Normal affect   ASSESSMENT:    1. Abnormal EKG   2. Primary hypertension   3. Smoking    PLAN:    In order of problems listed above:  Abnormal EKG, possible old septal infarct noted.  Denies symptoms of chest pain or shortness of breath.  We will get echocardiogram to evaluate any wall motion abnormalities.  No significant edema noted in the lower extremities on today's exam.  Continue BP meds as prescribed.  We will try to obtain EKG from PCPs office. Hypertension, BP controlled.  Continue amlodipine, Toprol-XL. Current smoker, cessation advised.  Follow-up after echocardiogram.     Medication Adjustments/Labs and Tests Ordered: Current medicines are reviewed at length with the patient today.  Concerns regarding medicines are outlined above.  Orders Placed This Encounter  Procedures   EKG 12-Lead   ECHOCARDIOGRAM COMPLETE    No orders of the defined types were placed in this encounter.   Patient Instructions  Medication Instructions:  Your physician recommends that you continue on your current medications as directed. Please refer to the Current Medication list given to you today.  *If you need a refill on your cardiac medications before your next appointment, please call your pharmacy*   Lab Work: None ordered If you have labs (blood work) drawn today and your tests are completely normal, you will receive your results  only by: MyChart Message (if you have MyChart) OR A paper copy in the mail If you have any lab test that is abnormal or we need to change your treatment, we will call you to review the results.   Testing/Procedures:  Your physician has requested that you have an echocardiogram. Echocardiography is a painless test that uses sound waves to create images of your heart. It provides your doctor with information about the size and shape of your heart and how well your heart's chambers and valves are working. This procedure takes approximately one hour. There are no restrictions for this procedure.     Follow-Up: At [facility], you  and your health needs are our priority.  As part of our continuing mission to provide you with exceptional heart care, we have created designated Provider Care Teams.  These Care Teams include your primary Cardiologist (physician) and Advanced Practice Providers (APPs -  Physician Assistants and Nurse Practitioners) who all work together to provide you with the care you need, when you need it.  We recommend signing up for the patient portal called "MyChart".  Sign up information is provided on this After Visit Summary.  MyChart is used to connect with patients for Virtual Visits (Telemedicine).  Patients are able to view lab/test results, encounter notes, upcoming appointments, etc.  Non-urgent messages can be sent to your provider as well.   To learn more about what you can do with MyChart, go to [email or web address].    Your next appointment:   Follow up after Echo   The format for your next appointment:   In Person  Provider:   You may see Debbe Odea, MD or one of the following Advanced Practice Providers on your designated Care Team:   Nicolasa Ducking, NP Eula Listen, PA-C Marisue Ivan, PA-C Cadence Fransico Michael, [place] Gillian Shields, NP   Other Instructions  Other Instructions     Signed, Debbe Odea, MD  12/23/2020 12:21 PM    [facility]

## 2021-01-05 DIAGNOSIS — G5601 Carpal tunnel syndrome, right upper limb: Secondary | ICD-10-CM | POA: Diagnosis not present

## 2021-01-05 DIAGNOSIS — G5602 Carpal tunnel syndrome, left upper limb: Secondary | ICD-10-CM | POA: Diagnosis not present

## 2021-02-02 ENCOUNTER — Other Ambulatory Visit: Payer: Self-pay | Admitting: Cardiology

## 2021-02-02 DIAGNOSIS — R9431 Abnormal electrocardiogram [ECG] [EKG]: Secondary | ICD-10-CM

## 2021-02-02 DIAGNOSIS — I252 Old myocardial infarction: Secondary | ICD-10-CM

## 2021-02-10 ENCOUNTER — Other Ambulatory Visit: Payer: BC Managed Care – PPO

## 2021-02-11 ENCOUNTER — Ambulatory Visit: Payer: BC Managed Care – PPO | Admitting: Cardiology

## 2021-03-18 ENCOUNTER — Other Ambulatory Visit: Payer: BC Managed Care – PPO

## 2021-03-24 ENCOUNTER — Ambulatory Visit: Payer: BC Managed Care – PPO | Admitting: Cardiology

## 2021-04-15 ENCOUNTER — Other Ambulatory Visit: Payer: BC Managed Care – PPO

## 2021-04-22 ENCOUNTER — Ambulatory Visit: Payer: BC Managed Care – PPO | Admitting: Cardiology

## 2021-09-01 ENCOUNTER — Other Ambulatory Visit: Payer: BC Managed Care – PPO

## 2021-09-05 ENCOUNTER — Ambulatory Visit: Payer: BC Managed Care – PPO | Admitting: Cardiology

## 2021-09-06 ENCOUNTER — Encounter: Payer: Self-pay | Admitting: Cardiology

## 2021-11-03 ENCOUNTER — Other Ambulatory Visit: Payer: BC Managed Care – PPO

## 2021-11-04 ENCOUNTER — Ambulatory Visit: Payer: BC Managed Care – PPO | Admitting: Cardiology

## 2021-11-16 ENCOUNTER — Other Ambulatory Visit: Payer: BC Managed Care – PPO

## 2021-12-07 DIAGNOSIS — Z124 Encounter for screening for malignant neoplasm of cervix: Secondary | ICD-10-CM | POA: Diagnosis not present

## 2021-12-07 DIAGNOSIS — Z1231 Encounter for screening mammogram for malignant neoplasm of breast: Secondary | ICD-10-CM | POA: Diagnosis not present

## 2021-12-07 DIAGNOSIS — Z01419 Encounter for gynecological examination (general) (routine) without abnormal findings: Secondary | ICD-10-CM | POA: Diagnosis not present

## 2021-12-07 DIAGNOSIS — Z803 Family history of malignant neoplasm of breast: Secondary | ICD-10-CM | POA: Diagnosis not present

## 2021-12-07 DIAGNOSIS — Z8042 Family history of malignant neoplasm of prostate: Secondary | ICD-10-CM | POA: Diagnosis not present

## 2021-12-13 ENCOUNTER — Ambulatory Visit: Payer: BC Managed Care – PPO | Admitting: Cardiology

## 2022-01-24 DIAGNOSIS — Z7183 Encounter for nonprocreative genetic counseling: Secondary | ICD-10-CM | POA: Diagnosis not present

## 2022-03-14 DIAGNOSIS — Z0000 Encounter for general adult medical examination without abnormal findings: Secondary | ICD-10-CM | POA: Diagnosis not present

## 2022-03-18 DIAGNOSIS — Z0001 Encounter for general adult medical examination with abnormal findings: Secondary | ICD-10-CM | POA: Diagnosis not present

## 2022-03-18 DIAGNOSIS — I10 Essential (primary) hypertension: Secondary | ICD-10-CM | POA: Diagnosis not present

## 2022-06-06 DIAGNOSIS — M549 Dorsalgia, unspecified: Secondary | ICD-10-CM | POA: Diagnosis not present

## 2022-06-06 DIAGNOSIS — I959 Hypotension, unspecified: Secondary | ICD-10-CM | POA: Diagnosis not present

## 2022-06-06 DIAGNOSIS — T68XXXA Hypothermia, initial encounter: Secondary | ICD-10-CM | POA: Diagnosis not present

## 2022-09-26 ENCOUNTER — Other Ambulatory Visit: Payer: Self-pay | Admitting: Obstetrics and Gynecology

## 2022-09-26 ENCOUNTER — Encounter: Payer: Self-pay | Admitting: Obstetrics and Gynecology

## 2022-09-26 DIAGNOSIS — Z6823 Body mass index (BMI) 23.0-23.9, adult: Secondary | ICD-10-CM | POA: Diagnosis not present

## 2022-09-26 DIAGNOSIS — N6320 Unspecified lump in the left breast, unspecified quadrant: Secondary | ICD-10-CM

## 2022-12-11 ENCOUNTER — Other Ambulatory Visit: Payer: BC Managed Care – PPO

## 2023-01-03 ENCOUNTER — Ambulatory Visit
Admission: RE | Admit: 2023-01-03 | Discharge: 2023-01-03 | Disposition: A | Discharge status: Home / Self Care | Payer: BC Managed Care – PPO | Source: Ambulatory Visit | Attending: Obstetrics and Gynecology | Admitting: Obstetrics and Gynecology

## 2023-01-03 DIAGNOSIS — N6320 Unspecified lump in the left breast, unspecified quadrant: Secondary | ICD-10-CM

## 2023-01-03 DIAGNOSIS — N644 Mastodynia: Secondary | ICD-10-CM | POA: Diagnosis not present

## 2023-04-03 DIAGNOSIS — Z6825 Body mass index (BMI) 25.0-25.9, adult: Secondary | ICD-10-CM | POA: Diagnosis not present

## 2023-04-03 DIAGNOSIS — Z124 Encounter for screening for malignant neoplasm of cervix: Secondary | ICD-10-CM | POA: Diagnosis not present

## 2023-04-03 DIAGNOSIS — Z01419 Encounter for gynecological examination (general) (routine) without abnormal findings: Secondary | ICD-10-CM | POA: Diagnosis not present

## 2023-05-09 ENCOUNTER — Ambulatory Visit
Admission: EM | Admit: 2023-05-09 | Discharge: 2023-05-09 | Disposition: A | Discharge status: Home / Self Care | Payer: BC Managed Care – PPO | Attending: Nurse Practitioner | Admitting: Nurse Practitioner

## 2023-05-09 ENCOUNTER — Other Ambulatory Visit: Payer: Self-pay

## 2023-05-09 ENCOUNTER — Encounter: Payer: Self-pay | Admitting: Emergency Medicine

## 2023-05-09 DIAGNOSIS — Z1152 Encounter for screening for COVID-19: Secondary | ICD-10-CM | POA: Insufficient documentation

## 2023-05-09 DIAGNOSIS — J069 Acute upper respiratory infection, unspecified: Secondary | ICD-10-CM | POA: Diagnosis not present

## 2023-05-09 MED ORDER — CETIRIZINE HCL 10 MG PO TABS: 10.0000 mg | ORAL_TABLET | Freq: Every day | ORAL | 0 refills | Status: AC

## 2023-05-09 MED ORDER — FLUTICASONE PROPIONATE 50 MCG/ACT NA SUSP: 2.0000 | Freq: Every day | NASAL | 0 refills | Status: AC

## 2023-05-09 MED ORDER — PSEUDOEPH-BROMPHEN-DM 30-2-10 MG/5ML PO SYRP: 5.0000 mL | ORAL_SOLUTION | Freq: Four times a day (QID) | ORAL | 0 refills | Status: AC | PRN

## 2023-05-09 NOTE — ED Provider Notes (Signed)
[facility]    CSN: [id number] Arrival date & time: 05/09/23  0813      History   Chief Complaint Chief Complaint  Patient presents with   Cough    HPI Carla Cohen is a 52 y.o. female.   The history is provided by the patient.   Patient presents with a 2-day history of nasal congestion, runny nose, and cough.  Patient denies headache, sore throat, ear pain, wheezing, shortness of breath, chest pain, abdominal pain, nausea, vomiting, or diarrhea.  Patient reports that she has been taking over-the-counter cough and cold medications with minimal relief.  Reports that she has been around someone with the same or similar symptoms.  Past Medical History:  Diagnosis Date   Anxiety    HTN (hypertension)    HTN (hypertension)    Hypertension    Obesity     Patient Active Problem List   Diagnosis Date Noted   Laceration of finger nail bed 08/20/2012    Past Surgical History:  Procedure Laterality Date   CESAREAN SECTION      OB History   No obstetric history on file.      Home Medications    Prior to Admission medications   Medication Sig Start Date End Date Taking? Authorizing Provider  brompheniramine-pseudoephedrine-DM 30-2-10 MG/5ML syrup Take 5 mLs by mouth 4 (four) times daily as needed. 05/09/23  Yes Leath-Warren, Sadie Haber, NP  cetirizine (ZYRTEC) 10 MG tablet Take 1 tablet (10 mg total) by mouth daily. 05/09/23  Yes Leath-Warren, Sadie Haber, NP  fluticasone (FLONASE) 50 MCG/ACT nasal spray Place 2 sprays into both nostrils daily. 05/09/23  Yes Leath-Warren, Sadie Haber, NP  amLODipine (NORVASC) 10 MG tablet Take 10 mg by mouth daily.    [provider]  aspirin EC 81 MG tablet Take 81 mg by mouth daily.    [provider]  FLUoxetine (PROZAC) 40 MG capsule fluoxetine 40 mg capsule    [provider]  levonorgestrel (MIRENA) 20 MCG/24HR IUD Mirena 20 mcg/24 hr (5 years) intrauterine device  Take 1 device by  intrauterine route.    [provider]  metoprolol succinate (TOPROL-XL) 25 MG 24 hr tablet Take 1 tablet by mouth daily. 11/01/20   [provider]    Family History Family History  Problem Relation Age of Onset   Stroke Mother    Breast cancer Maternal Grandmother    Stroke Maternal Grandmother    Aneurysm Maternal Grandfather        brain   Liver cancer Paternal Grandmother    Prostate cancer Paternal Grandfather     Social History Social History   Tobacco Use   Smoking status: Every Day    Current packs/day: 0.25    Average packs/day: 0.3 packs/day for 32.2 years (8.1 ttl pk-yrs)    Types: Cigarettes    Start date: 02/19/1991   Smokeless tobacco: Never  Substance Use Topics   Alcohol use: Yes    Alcohol/week: 2.0 standard drinks of alcohol    Types: 2 Shots of liquor per week    Comment: daily   Drug use: No     Allergies   Penicillins   Review of Systems Review of Systems Per HPI  Physical Exam Triage Vital Signs ED Triage Vitals [05/09/23 0832]  Encounter Vitals Group     BP 135/82     Systolic BP Percentile      Diastolic BP Percentile      Pulse Rate 69  Resp 20     Temp 98.1 F (36.7 C)     Temp Source Oral     SpO2 96 %     Weight      Height      Head Circumference      Peak Flow      Pain Score 0     Pain Loc      Pain Education      Exclude from Growth Chart    No data found.  Updated Vital Signs BP 135/82 (BP Location: Right Arm)   Pulse 69   Temp 98.1 F (36.7 C) (Oral)   Resp 20   SpO2 96%   Visual Acuity Right Eye Distance:   Left Eye Distance:   Bilateral Distance:    Right Eye Near:   Left Eye Near:    Bilateral Near:     Physical Exam Vitals and nursing note reviewed.  Constitutional:      General: She is not in acute distress.    Appearance: Normal appearance.  HENT:     Head: Normocephalic.     Right Ear: Tympanic membrane, ear canal and external ear normal.     Left Ear: Tympanic  membrane, ear canal and external ear normal.     Nose: Congestion present.     Right Turbinates: Enlarged and swollen.     Left Turbinates: Enlarged and swollen.     Right Sinus: No maxillary sinus tenderness or frontal sinus tenderness.     Left Sinus: No maxillary sinus tenderness or frontal sinus tenderness.     Mouth/Throat:     Lips: Pink.     Mouth: Mucous membranes are moist.     Pharynx: Posterior oropharyngeal erythema present.  Eyes:     Extraocular Movements: Extraocular movements intact.     Pupils: Pupils are equal, round, and reactive to light.  Cardiovascular:     Rate and Rhythm: Normal rate and regular rhythm.     Pulses: Normal pulses.     Heart sounds: Normal heart sounds.  Pulmonary:     Effort: Pulmonary effort is normal. No respiratory distress.     Breath sounds: Normal breath sounds. No stridor. No wheezing, rhonchi or rales.  Abdominal:     General: Bowel sounds are normal.     Palpations: Abdomen is soft.     Tenderness: There is no abdominal tenderness.  Musculoskeletal:     Cervical back: Normal range of motion.  Lymphadenopathy:     Cervical: No cervical adenopathy.  Skin:    General: Skin is warm and dry.  Neurological:     General: No focal deficit present.     Mental Status: She is alert and oriented to person, place, and time.  Psychiatric:        Mood and Affect: Mood normal.        Behavior: Behavior normal.      UC Treatments / Results  Labs (all labs ordered are listed, but only abnormal results are displayed) Labs Reviewed  SARS CORONAVIRUS 2 (TAT 6-24 HRS)    EKG   Radiology No results found.  Procedures Procedures (including critical care time)  Medications Ordered in UC Medications - No data to display  Initial Impression / Assessment and Plan / UC Course  I have reviewed the triage vital signs and the nursing notes.  Pertinent labs & imaging results that were available during my care of the patient were reviewed  by me and considered in my  medical decision making (see chart for details).  Symptoms consistent with a viral upper respiratory infection with cough.  COVID test is pending.  Patient is able to receive Paxlovid if her COVID test is positive.  Will provide symptomatic treatment with Bromfed DM for her cough, fluticasone 50 mcg nasal spray for nasal congestion, and cetirizine 10 mg for nasal congestion and nasal drainage.  Supportive care recommendations were provided and discussed with the patient to include increasing fluids, over-the-counter analgesics, normal saline nasal spray, and use of a humidifier during sleep.  Discussed indications with the patient about follow-up be necessary.  Patient is in agreement with this plan of care and verbalizes understanding.  All questions were answered.  Patient stable for discharge.  Work note was provided.  Final Clinical Impressions(s) / UC Diagnoses   Final diagnoses:  Viral upper respiratory tract infection with cough  Encounter for screening for COVID-19     Discharge Instructions      You have a viral upper respiratory infection.  Symptoms should improve over the next week to 10 days.  If you develop chest pain or shortness of breath, go to the emergency room.   We have tested you today for COVID-19.  You will see the results in Mychart and we will call you with positive results.  You may resume your normal activities as long as you do not have a fever.  If you develop a fever, you will need to stay home until been fever free for 24 hours with no medication.  You will need to wear a mask when you are out in public as long as you have symptoms.  Some things that can make you feel better are: - Increased rest - Increasing fluid with water/sugar free electrolytes - Acetaminophen and ibuprofen as needed for fever/pain - Salt water gargling, chloraseptic spray and throat lozenges - OTC guaifenesin (Mucinex) 600 mg twice daily - Saline sinus flushes  or a neti pot - Humidifying the air  As discussed, if your symptoms do not improve within the next 10 to 14 days, or if they suddenly worsen, it is recommended that you follow-up in this clinic for reevaluation. Follow-up as needed.      ED Prescriptions     Medication Sig Dispense Auth. Provider   brompheniramine-pseudoephedrine-DM 30-2-10 MG/5ML syrup Take 5 mLs by mouth 4 (four) times daily as needed. 140 mL Leath-Warren, Sadie Haber, NP   cetirizine (ZYRTEC) 10 MG tablet Take 1 tablet (10 mg total) by mouth daily. 30 tablet Leath-Warren, Sadie Haber, NP   fluticasone (FLONASE) 50 MCG/ACT nasal spray Place 2 sprays into both nostrils daily. 16 g Leath-Warren, Sadie Haber, NP      PDMP not reviewed this encounter.   Abran Cantor, NP 05/09/23 1026

## 2023-05-09 NOTE — ED Triage Notes (Signed)
Pt reports cough, runny nose x2 days. Denies any known fevers, gi/gu symptoms.

## 2023-05-09 NOTE — Discharge Instructions (Addendum)
You have a viral upper respiratory infection.  Symptoms should improve over the next week to 10 days.  If you develop chest pain or shortness of breath, go to the emergency room.   We have tested you today for COVID-19.  You will see the results in Mychart and we will call you with positive results.  You may resume your normal activities as long as you do not have a fever.  If you develop a fever, you will need to stay home until been fever free for 24 hours with no medication.  You will need to wear a mask when you are out in public as long as you have symptoms.  Some things that can make you feel better are: - Increased rest - Increasing fluid with water/sugar free electrolytes - Acetaminophen and ibuprofen as needed for fever/pain - Salt water gargling, chloraseptic spray and throat lozenges - OTC guaifenesin (Mucinex) 600 mg twice daily - Saline sinus flushes or a neti pot - Humidifying the air  As discussed, if your symptoms do not improve within the next 10 to 14 days, or if they suddenly worsen, it is recommended that you follow-up in this clinic for reevaluation. Follow-up as needed.

## 2023-05-10 LAB — SARS CORONAVIRUS 2 (TAT 6-24 HRS): SARS Coronavirus 2: NEGATIVE

## 2024-02-13 DIAGNOSIS — I10 Essential (primary) hypertension: Secondary | ICD-10-CM | POA: Diagnosis not present

## 2024-02-13 DIAGNOSIS — Z131 Encounter for screening for diabetes mellitus: Secondary | ICD-10-CM | POA: Diagnosis not present

## 2024-02-20 DIAGNOSIS — I10 Essential (primary) hypertension: Secondary | ICD-10-CM | POA: Diagnosis not present

## 2024-02-20 DIAGNOSIS — M19042 Primary osteoarthritis, left hand: Secondary | ICD-10-CM | POA: Diagnosis not present

## 2024-02-20 DIAGNOSIS — Z0000 Encounter for general adult medical examination without abnormal findings: Secondary | ICD-10-CM | POA: Diagnosis not present

## 2024-02-20 DIAGNOSIS — E785 Hyperlipidemia, unspecified: Secondary | ICD-10-CM | POA: Diagnosis not present

## 2024-02-20 DIAGNOSIS — M19041 Primary osteoarthritis, right hand: Secondary | ICD-10-CM | POA: Diagnosis not present

## 2024-05-27 DIAGNOSIS — E785 Hyperlipidemia, unspecified: Secondary | ICD-10-CM | POA: Diagnosis not present

## 2024-06-02 DIAGNOSIS — I10 Essential (primary) hypertension: Secondary | ICD-10-CM | POA: Diagnosis not present

## 2024-06-24 DIAGNOSIS — Z01419 Encounter for gynecological examination (general) (routine) without abnormal findings: Secondary | ICD-10-CM | POA: Diagnosis not present

## 2024-06-24 DIAGNOSIS — Z1231 Encounter for screening mammogram for malignant neoplasm of breast: Secondary | ICD-10-CM | POA: Diagnosis not present
